# Patient Record
Sex: Female | Born: 2000
Health system: Southern US, Community
[De-identification: ages and names within clinical notes are randomized; demographics above are authoritative.]

## PROBLEM LIST (undated history)

## (undated) DIAGNOSIS — R51 Headache: Secondary | ICD-10-CM

## (undated) DIAGNOSIS — R55 Syncope and collapse: Secondary | ICD-10-CM

## (undated) DIAGNOSIS — R519 Headache, unspecified: Secondary | ICD-10-CM

## (undated) HISTORY — DX: Syncope and collapse: R55

## (undated) HISTORY — DX: Headache: R51

## (undated) HISTORY — DX: Headache, unspecified: R51.9

---
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
## 2020-02-24 ENCOUNTER — Encounter: Payer: Self-pay | Admitting: Primary Care

## 2020-02-24 ENCOUNTER — Ambulatory Visit (INDEPENDENT_AMBULATORY_CARE_PROVIDER_SITE_OTHER): Payer: Managed Care, Other (non HMO) | Admitting: Primary Care

## 2020-02-24 ENCOUNTER — Other Ambulatory Visit: Payer: Self-pay

## 2020-02-24 VITALS — BP 110/70 | HR 65 | Temp 96.5°F | Ht 62.0 in | Wt 120.5 lb

## 2020-02-24 DIAGNOSIS — Z3042 Encounter for surveillance of injectable contraceptive: Secondary | ICD-10-CM

## 2020-02-24 DIAGNOSIS — R519 Headache, unspecified: Secondary | ICD-10-CM

## 2020-02-24 DIAGNOSIS — Z113 Encounter for screening for infections with a predominantly sexual mode of transmission: Secondary | ICD-10-CM | POA: Diagnosis not present

## 2020-02-24 DIAGNOSIS — Z30013 Encounter for initial prescription of injectable contraceptive: Secondary | ICD-10-CM

## 2020-02-24 LAB — POCT URINE PREGNANCY: Preg Test, Ur: NEGATIVE

## 2020-02-24 MED ORDER — MEDROXYPROGESTERONE ACETATE 150 MG/ML IM SUSP
150.0000 mg | Freq: Once | INTRAMUSCULAR | Status: AC
  Administered 2020-02-24: 12:00:00 150 mg via INTRAMUSCULAR

## 2020-02-24 NOTE — Addendum Note (Signed)
Addended by: Tawnya Crook on: 02/24/2020 11:43 AM   Modules accepted: Orders

## 2020-02-24 NOTE — Patient Instructions (Signed)
Stop by the lab prior to leaving today. I will notify you of your results once received.   Please update me regarding your headaches.  It was a pleasure to see you today!

## 2020-02-24 NOTE — Assessment & Plan Note (Signed)
Improved but without resolve while on Depo Provera, increased when she came off of Depo Provera.  Long discussion today regarding treatment for frequent headaches including resuming Depo Provera, trying OCP's, daily preventative treatment.   Today she opts to resume Depo Provera and will update if headaches do not resolve. I suspect she'll need daily preventative treatment along with hormone treatment. She will update.

## 2020-02-24 NOTE — Assessment & Plan Note (Signed)
Resuming Depo Provera per patient request. Negative urine pregnancy test today.

## 2020-02-24 NOTE — Assessment & Plan Note (Signed)
Orders placed. Asymptomatic.

## 2020-02-24 NOTE — Progress Notes (Signed)
Subjective:    Patient ID: Tanya Burnett, female    DOB: 10/16/2001, 19 y.o.   MRN: [id number]  HPI  This visit occurred during the SARS-CoV-2 public health emergency.  Safety protocols were in place, including screening questions prior to the visit, additional usage of staff PPE, and extensive cleaning of exam room while observing appropriate contact time as indicated for disinfecting solutions.   Tanya Burnett is a 19 year old female who presents today to resume Depo Provera.  She would like to resume Depo Provera for headaches and pregnancy prevention. She was initiated on Depo Provera in July 2020 for symptoms of menorrhagia, dysmenorrhea, headaches, pregnancy prevention.   She stopped Depo Provera in September 2020 due to intermittent breakthrough vaginal bleeding for two months. While managed on Depo Provera her headaches were less frequent and less intense. She still had headaches every other day.  She would now like to resume Depo Provera for pregnancy prevention and headache management. Since coming off of Depo Provera her headaches are daily and more intense, located around her entire head like a "band". She's not had a menstrual cycle since September 2020. She is sexually active, using condoms for protection.   She would also like STD testing. She denies vaginal itching, vaginal discharge.   Review of Systems  Genitourinary: Negative for dysuria and vaginal discharge.       See HPI  Neurological: Positive for headaches.       Past Medical History:  Diagnosis Date  . Frequent headaches   . Syncope      Social History   Socioeconomic History  . Marital status: Single    Spouse name: Not on file  . Number of children: Not on file  . Years of education: Not on file  . Highest education level: Not on file  Occupational History  . Not on file  Tobacco Use  . Smoking status: Never Smoker  . Smokeless tobacco: Never Used  Substance and Sexual Activity  . Alcohol use:  Never  . Drug use: Never  . Sexual activity: Not on file  Other Topics Concern  . Not on file  Social History Narrative  . Not on file   Social Determinants of Health   Financial Resource Strain:   . Difficulty of Paying Living Expenses: Not on file  Food Insecurity:   . Worried About Programme researcher, broadcasting/film/video in the Last Year: Not on file  . Ran Out of Food in the Last Year: Not on file  Transportation Needs:   . Lack of Transportation (Medical): Not on file  . Lack of Transportation (Non-Medical): Not on file  Physical Activity:   . Days of Exercise per Week: Not on file  . Minutes of Exercise per Session: Not on file  Stress:   . Feeling of Stress : Not on file  Social Connections:   . Frequency of Communication with Friends and Family: Not on file  . Frequency of Social Gatherings with Friends and Family: Not on file  . Attends Religious Services: Not on file  . Active Member of Clubs or Organizations: Not on file  . Attends Banker Meetings: Not on file  . Marital Status: Not on file  Intimate Partner Violence:   . Fear of Current or Ex-Partner: Not on file  . Emotionally Abused: Not on file  . Physically Abused: Not on file  . Sexually Abused: Not on file    No past surgical history on file.  Family History  Problem Relation Age of Onset  . Depression Mother   . Depression Father   . Alcohol abuse Father     No Known Allergies  Current Outpatient Medications on File Prior to Visit  Medication Sig Dispense Refill  . medroxyPROGESTERone (DEPO-PROVERA) 150 MG/ML injection Inject 150 mg into the muscle every 3 (three) months.     No current facility-administered medications on file prior to visit.    BP 110/70   Pulse 65   Temp (!) 96.5 F (35.8 C) (Temporal)   Ht 5\' 2"  (1.575 m)   Wt 120 lb 8 oz (54.7 kg)   LMP  (LMP Unknown)   SpO2 98%   BMI 22.04 kg/m    Objective:   Physical Exam  Constitutional: She appears well-nourished.    Cardiovascular: Normal rate and regular rhythm.  Respiratory: Effort normal and breath sounds normal.  Musculoskeletal:     Cervical back: Neck supple.  Skin: Skin is warm and dry.  Psychiatric: She has a normal mood and affect.           Assessment & Plan:

## 2020-03-04 LAB — C. TRACHOMATIS/N. GONORRHOEAE RNA
C. trachomatis RNA, TMA: NOT DETECTED
N. gonorrhoeae RNA, TMA: NOT DETECTED

## 2020-03-04 LAB — HSV 1/2 AB (IGM), IFA W/RFLX TITER
HSV 1 IgM Screen: NEGATIVE
HSV 2 IgM Screen: NEGATIVE

## 2020-03-04 LAB — HIV ANTIBODY (ROUTINE TESTING W REFLEX): HIV 1&2 Ab, 4th Generation: NONREACTIVE

## 2020-03-04 LAB — HSV(HERPES SIMPLEX VRS) I + II AB-IGG
HAV 1 IGG,TYPE SPECIFIC AB: <0.9 index
HSV 2 IGG,TYPE SPECIFIC AB: <0.9 index

## 2020-03-04 LAB — HEPATITIS C ANTIBODY
Hepatitis C Ab: NONREACTIVE
SIGNAL TO CUT-OFF: 0.01 (ref <1.00)

## 2020-03-04 LAB — RPR: RPR Ser Ql: NONREACTIVE

## 2020-06-02 ENCOUNTER — Encounter: Payer: Self-pay | Admitting: Primary Care

## 2020-06-02 ENCOUNTER — Ambulatory Visit (INDEPENDENT_AMBULATORY_CARE_PROVIDER_SITE_OTHER): Payer: Managed Care, Other (non HMO) | Admitting: Primary Care

## 2020-06-02 ENCOUNTER — Other Ambulatory Visit: Payer: Self-pay

## 2020-06-02 ENCOUNTER — Telehealth: Payer: Self-pay

## 2020-06-02 VITALS — BP 112/70 | HR 72 | Temp 96.1°F | Ht 62.0 in | Wt 118.5 lb

## 2020-06-02 DIAGNOSIS — R519 Headache, unspecified: Secondary | ICD-10-CM

## 2020-06-02 MED ORDER — PROPRANOLOL HCL ER 80 MG PO CP24: 80.0000 mg | ORAL_CAPSULE | Freq: Every day | ORAL | 1 refills | Status: DC

## 2020-06-02 NOTE — Assessment & Plan Note (Signed)
Continued despite Depo Provera injections. Discussed options for treatment, she is ready for daily preventative medication.  Rx for propranolol ER 80 mg sent to pharmacy. We will plan to follow up in one month for re-evaluation.

## 2020-06-02 NOTE — Telephone Encounter (Signed)
[facility] Night - Client Nonclinical Telephone Record AccessNurse Client [facility] Night - Client Client Site [facility] [place] - Night Physician [facility] - PHYSICIAN, Crissie Figures- MD Contact Type Call Who Is Calling Patient / Member / Family / Caregiver Caller Name Shreya Lacasse Caller Phone Number [phone] Patient Name Tanya Burnett Patient DOB 10/04/2001 Call Type Message Only Information Provided Reason for Call Request to Schedule Office Appointment Initial Comment Caller states has 7:40 AM appt; GPS says should arrive 7:55 so may be a few minutes late. Additional Comment Caller declined triage. Disp. Time Disposition Final User 06/02/2020 7:14:12 AM General Information Provided Yes Albin Fischer Call Closed By: Albin Fischer Transaction Date/Time: 06/02/2020 7:10:54 AM (ET)

## 2020-06-02 NOTE — Progress Notes (Signed)
Subjective:    Patient ID: Tanya Burnett, female    DOB: 12/17/2001, 19 y.o.   MRN: [id number]  HPI  This visit occurred during the SARS-CoV-2 public health emergency.  Safety protocols were in place, including screening questions prior to the visit, additional usage of staff PPE, and extensive cleaning of exam room while observing appropriate contact time as indicated for disinfecting solutions.   Tanya Burnett is a 19 year old female with a history of frequent headaches, syncope who presents today with a chief complaint of headache.  She was last evaluated for headaches in March 2021, and at that time headaches improved slightly while on Depo Provera so we resumed her treatment. We also discussed the potential need for daily oral preventative treatment if headaches did not resolve with re-initiation of Depo Provera.  Today she endorses that her headaches improved on Depo Provera but they did not resolve, she is out of date range for her Depo Provera injection but is not wanting another.   Her headaches are daily and feel like a "band around my head", described as a throbbing sensation. She has nausea and photophobia daily with headaches. She has vomited a few times with severe headaches. She is taking Ibuprofen and Tylenol on occasion with little improvement.   Review of Systems  Eyes: Positive for photophobia.  Gastrointestinal: Positive for nausea.  Neurological: Positive for headaches. Negative for dizziness.       Past Medical History:  Diagnosis Date  . Frequent headaches   . Syncope      Social History   Socioeconomic History  . Marital status: Single    Spouse name: Not on file  . Number of children: Not on file  . Years of education: Not on file  . Highest education level: Not on file  Occupational History  . Not on file  Tobacco Use  . Smoking status: Never Smoker  . Smokeless tobacco: Never Used  Vaping Use  . Vaping Use: Never used  Substance and Sexual  Activity  . Alcohol use: Never  . Drug use: Never  . Sexual activity: Not on file  Other Topics Concern  . Not on file  Social History Narrative  . Not on file   Social Determinants of Health   Financial Resource Strain:   . Difficulty of Paying Living Expenses:   Food Insecurity:   . Worried About Programme researcher, broadcasting/film/video in the Last Year:   . Barista in the Last Year:   Transportation Needs:   . Freight forwarder (Medical):   Marland Kitchen Lack of Transportation (Non-Medical):   Physical Activity:   . Days of Exercise per Week:   . Minutes of Exercise per Session:   Stress:   . Feeling of Stress :   Social Connections:   . Frequency of Communication with Friends and Family:   . Frequency of Social Gatherings with Friends and Family:   . Attends Religious Services:   . Active Member of Clubs or Organizations:   . Attends Banker Meetings:   Marland Kitchen Marital Status:   Intimate Partner Violence:   . Fear of Current or Ex-Partner:   . Emotionally Abused:   Marland Kitchen Physically Abused:   . Sexually Abused:     No past surgical history on file.  Family History  Problem Relation Age of Onset  . Depression Mother   . Depression Father   . Alcohol abuse Father     No Known Allergies  Current Outpatient Medications on File Prior to Visit  Medication Sig Dispense Refill  . medroxyPROGESTERone (DEPO-PROVERA) 150 MG/ML injection Inject 150 mg into the muscle every 3 (three) months. (Patient not taking: Reported on 06/02/2020)     No current facility-administered medications on file prior to visit.    BP 112/70   Pulse 72   Temp (!) 96.1 F (35.6 C) (Temporal)   Ht 5\' 2"  (1.575 m)   Wt 118 lb 8 oz (53.8 kg)   SpO2 97%   BMI 21.67 kg/m    Objective:   Physical Exam  Cardiovascular: Normal rate and regular rhythm.  Respiratory: Effort normal and breath sounds normal.  Neurological: She is alert. No cranial nerve deficit.  Psychiatric: Mood normal.             Assessment & Plan:

## 2020-06-02 NOTE — Patient Instructions (Signed)
Start propranolol ER 80 mg once daily for headache prevention.   Schedule a follow up visit with me for one month for re-evaluation.  It was a pleasure to see you today!

## 2020-06-02 NOTE — Telephone Encounter (Signed)
Per chart review tab pt was seen this morning at [facility].

## 2020-06-02 NOTE — Telephone Encounter (Signed)
Noted. Patient was seen today.

## 2020-06-14 ENCOUNTER — Ambulatory Visit: Payer: Managed Care, Other (non HMO)

## 2020-06-15 ENCOUNTER — Other Ambulatory Visit: Payer: Self-pay

## 2020-06-15 ENCOUNTER — Ambulatory Visit (INDEPENDENT_AMBULATORY_CARE_PROVIDER_SITE_OTHER): Payer: Managed Care, Other (non HMO)

## 2020-06-15 DIAGNOSIS — Z30013 Encounter for initial prescription of injectable contraceptive: Secondary | ICD-10-CM | POA: Diagnosis not present

## 2020-06-15 LAB — POCT URINE PREGNANCY: Preg Test, Ur: NEGATIVE

## 2020-06-21 ENCOUNTER — Ambulatory Visit: Payer: Managed Care, Other (non HMO) | Admitting: Primary Care

## 2020-06-22 ENCOUNTER — Ambulatory Visit (INDEPENDENT_AMBULATORY_CARE_PROVIDER_SITE_OTHER): Payer: Managed Care, Other (non HMO) | Admitting: *Deleted

## 2020-06-22 ENCOUNTER — Other Ambulatory Visit: Payer: Self-pay

## 2020-06-22 DIAGNOSIS — Z3042 Encounter for surveillance of injectable contraceptive: Secondary | ICD-10-CM | POA: Diagnosis not present

## 2020-06-22 LAB — POCT URINE PREGNANCY: Preg Test, Ur: NEGATIVE

## 2020-06-22 MED ORDER — MEDROXYPROGESTERONE ACETATE 150 MG/ML IM SUSP
150.0000 mg | Freq: Once | INTRAMUSCULAR | Status: AC
  Administered 2020-06-22: 150 mg via INTRAMUSCULAR

## 2020-06-22 NOTE — Progress Notes (Signed)
Per orders of Mayra Reel, NP, injection of medroxyPROGESTERone (DEPO-PROVERA) injection 150 mg given by Wilfrid Lund. Patient tolerated injection well.

## 2020-07-03 ENCOUNTER — Ambulatory Visit: Payer: Managed Care, Other (non HMO) | Admitting: Primary Care

## 2020-07-03 DIAGNOSIS — Z0289 Encounter for other administrative examinations: Secondary | ICD-10-CM

## 2020-09-07 ENCOUNTER — Telehealth: Payer: Self-pay

## 2020-09-07 ENCOUNTER — Telehealth (INDEPENDENT_AMBULATORY_CARE_PROVIDER_SITE_OTHER): Payer: Managed Care, Other (non HMO) | Admitting: Primary Care

## 2020-09-07 ENCOUNTER — Other Ambulatory Visit: Payer: Self-pay | Admitting: Primary Care

## 2020-09-07 ENCOUNTER — Encounter: Payer: Self-pay | Admitting: Primary Care

## 2020-09-07 ENCOUNTER — Telehealth: Payer: Self-pay | Admitting: Primary Care

## 2020-09-07 ENCOUNTER — Other Ambulatory Visit: Payer: Self-pay

## 2020-09-07 VITALS — Ht 62.0 in | Wt 118.0 lb

## 2020-09-07 DIAGNOSIS — R109 Unspecified abdominal pain: Secondary | ICD-10-CM | POA: Insufficient documentation

## 2020-09-07 DIAGNOSIS — R1033 Periumbilical pain: Secondary | ICD-10-CM

## 2020-09-07 NOTE — Telephone Encounter (Signed)
Tanya Burnett had me check into the STAT labs we had ordered for lab collect at [facility] location tonight at [phone].  The labs had been drawn at noon and we still had no results.  When I spoke to [facility] they said it was not seen that they were ordered STAT so they were in transit to [facility] for processing still.  The [employer] did awknowledge that they had in fact been ordered STAT and apologized for the error the lab made.  However with them in transport we will have to wait for results to come through after getting to lab and being processed.   Long story short, Tanya Burnett will you please check the fax machine first thing in the morning for results and pass them on to Dr Sharen Hones.  If there is not fax, please call [facility] or go online to check on the results for specimen [id number] and report to Dr Sharen Hones.  Thank you all for your help getting this taken care of!  Have a great weekend! Tam

## 2020-09-07 NOTE — Progress Notes (Signed)
Subjective:    Patient ID: Tanya Burnett, female    DOB: 07-Jan-2001, 19 y.o.   MRN: [id number]  HPI  Virtual Visit via Video Note  I connected with Tanya Burnett on 09/07/20 at  9:40 AM EDT by a video enabled telemedicine application and verified that I am speaking with the correct person using two identifiers.  Location: Patient: Home Provider: [place]   I discussed the limitations of evaluation and management by telemedicine and the availability of in person appointments. The patient expressed understanding and agreed to proceed.  History of Present Illness:  Tanya Burnett is a 19 year old female with a history of syncope, frequent headaches, leukopenia, proteinuria who presents today with a chief complaint of abdominal pain.  Her pain is located to the periumbilical region which began about three weeks ago. Her abdominal pain is constant, but worse within 10 minuets after eating. She also reports nausea, vomiting, diarrhea with urgency. She is vomiting once two twice daily with and without eating. She is having diarrhea twice to numerous times daily. She had a "fever" twice, doesn't recall the readings.   She's been taking Tylenol, Pepto Bismol, Tylenol, and some Pedialyte without improvement. She is hardly drinking or eating much.   She denies known Covid-19 exposure, cough, loss of taste/smell.  She has not been vaccinated for Covid-19.    Observations/Objective:  Alert and oriented. Appears well, not sickly. No distress. Speaking in complete sentences.  Assessment and Plan:  Acute periumbilical abdominal pain with n/v/d x 1 week. Appears uncomfortable today during visit, abdomen was not tender to her palpation.  Symptoms are concerning given her age, location of pain, and dehydration.  She lives in [place]. We will try to arrange stat labs for [facility] near her. We will also try to arrange stat imaging, CT abdomen/pelvis with contrast to rule out acute  appendicitis/gall bladder involvement.   If we cannot arrange then we will recommend she be seen in the ED near her home.   Follow Up Instructions:  We will be in touch regarding your CT and labs.  It was a pleasure to see you today!    I discussed the assessment and treatment plan with the patient. The patient was provided an opportunity to ask questions and all were answered. The patient agreed with the plan and demonstrated an understanding of the instructions.   The patient was advised to call back or seek an in-person evaluation if the symptoms worsen or if the condition fails to improve as anticipated.    Doreene Nest, NP    Review of Systems  Constitutional: Positive for fever.  Gastrointestinal: Positive for abdominal pain, diarrhea, nausea and vomiting.       Past Medical History:  Diagnosis Date  . Frequent headaches   . Syncope      Social History   Socioeconomic History  . Marital status: Single    Spouse name: Not on file  . Number of children: Not on file  . Years of education: Not on file  . Highest education level: Not on file  Occupational History  . Not on file  Tobacco Use  . Smoking status: Never Smoker  . Smokeless tobacco: Never Used  Vaping Use  . Vaping Use: Never used  Substance and Sexual Activity  . Alcohol use: Never  . Drug use: Never  . Sexual activity: Not on file  Other Topics Concern  . Not on file  Social History Narrative  . Not on  file   Social Determinants of Health   Financial Resource Strain:   . Difficulty of Paying Living Expenses: Not on file  Food Insecurity:   . Worried About Programme researcher, broadcasting/film/video in the Last Year: Not on file  . Ran Out of Food in the Last Year: Not on file  Transportation Needs:   . Lack of Transportation (Medical): Not on file  . Lack of Transportation (Non-Medical): Not on file  Physical Activity:   . Days of Exercise per Week: Not on file  . Minutes of Exercise per Session: Not on  file  Stress:   . Feeling of Stress : Not on file  Social Connections:   . Frequency of Communication with Friends and Family: Not on file  . Frequency of Social Gatherings with Friends and Family: Not on file  . Attends Religious Services: Not on file  . Active Member of Clubs or Organizations: Not on file  . Attends Banker Meetings: Not on file  . Marital Status: Not on file  Intimate Partner Violence:   . Fear of Current or Ex-Partner: Not on file  . Emotionally Abused: Not on file  . Physically Abused: Not on file  . Sexually Abused: Not on file    History reviewed. No pertinent surgical history.  Family History  Problem Relation Age of Onset  . Depression Mother   . Depression Father   . Alcohol abuse Father     No Known Allergies  No current outpatient medications on file prior to visit.   No current facility-administered medications on file prior to visit.    Ht 5\' 2"  (1.575 m)   Wt 118 lb (53.5 kg)   BMI 21.58 kg/m    Objective:   Physical Exam Constitutional:      General: She is not in acute distress.    Comments: Appears tired  Abdominal:     Tenderness: There is no abdominal tenderness.  Neurological:     Mental Status: She is alert and oriented to person, place, and time.            Assessment & Plan:

## 2020-09-07 NOTE — Assessment & Plan Note (Signed)
Acute periumbilical abdominal pain with n/v/d x 1 week. Appears uncomfortable today during visit, abdomen was not tender to her palpation.  Symptoms are concerning given her age, location of pain, and dehydration.  She lives in [place]. We will try to arrange stat labs for [facility] near her. We will also try to arrange stat imaging, CT abdomen/pelvis with contrast to rule out acute appendicitis/gall bladder involvement.   If we cannot arrange then we will recommend she be seen in the ED near her home.

## 2020-09-07 NOTE — Patient Instructions (Signed)
We will be in touch regarding your CT and labs.  It was a pleasure to see you today!

## 2020-09-07 NOTE — Telephone Encounter (Signed)
I attempted to reach patient via phone regarding her CT abdomen/pelvis results, the number called "was not receiving calls". Please try to reach patient and notify her that her CT scan was negative.  I am still waiting on her STAT labs which should be in by tomorrow morning. [facility] did not send them out STAT despite my order. They are correcting their error now and will call us in the morning.  Nicki Reaper will be covering for me, see her for assistance as I will be out of the office.

## 2020-09-07 NOTE — Addendum Note (Signed)
Addended by: Aquilla Solian on: 09/07/2020 05:10 PM   Modules accepted: Orders

## 2020-09-07 NOTE — Telephone Encounter (Signed)
Tanya Canes, do you mind taking a look at these labs in the AM? I will be on the road. Rene Kocher is covering for me but won't be in until 11 am. CT scan negative.

## 2020-09-08 LAB — COMPREHENSIVE METABOLIC PANEL
ALT: 8 IU/L (ref 0–32)
AST: 14 IU/L (ref 0–40)
Albumin/Globulin Ratio: 1.8 (ref 1.2–2.2)
Albumin: 4.9 g/dL (ref 3.9–5.0)
Alkaline Phosphatase: 63 IU/L (ref 42–106)
BUN/Creatinine Ratio: 8 — ABNORMAL LOW (ref 9–23)
BUN: 6 mg/dL (ref 6–20)
Bilirubin Total: 0.4 mg/dL (ref 0.0–1.2)
CO2: 24 mmol/L (ref 20–29)
Calcium: 9.9 mg/dL (ref 8.7–10.2)
Chloride: 103 mmol/L (ref 96–106)
Creatinine, Ser: 0.74 mg/dL (ref 0.57–1.00)
GFR calc Af Amer: 137 mL/min/{1.73_m2} (ref >59)
GFR calc non Af Amer: 119 mL/min/{1.73_m2} (ref >59)
Globulin, Total: 2.8 g/dL (ref 1.5–4.5)
Glucose: 87 mg/dL (ref 65–99)
Potassium: 4 mmol/L (ref 3.5–5.2)
Sodium: 141 mmol/L (ref 134–144)
Total Protein: 7.7 g/dL (ref 6.0–8.5)

## 2020-09-08 LAB — CBC WITH DIFFERENTIAL/PLATELET
Basophils Absolute: 0 10*3/uL (ref 0.0–0.2)
Basos: 1 %
EOS (ABSOLUTE): 0.1 10*3/uL (ref 0.0–0.4)
Eos: 2 %
Hematocrit: 41.3 % (ref 34.0–46.6)
Hemoglobin: 13.8 g/dL (ref 11.1–15.9)
Immature Grans (Abs): 0 10*3/uL (ref 0.0–0.1)
Immature Granulocytes: 0 %
Lymphocytes Absolute: 1.1 10*3/uL (ref 0.7–3.1)
Lymphs: 34 %
MCH: 29.1 pg (ref 26.6–33.0)
MCHC: 33.4 g/dL (ref 31.5–35.7)
MCV: 87 fL (ref 79–97)
Monocytes Absolute: 0.2 10*3/uL (ref 0.1–0.9)
Monocytes: 7 %
Neutrophils Absolute: 1.9 10*3/uL (ref 1.4–7.0)
Neutrophils: 56 %
Platelets: 379 10*3/uL (ref 150–450)
RBC: 4.74 x10E6/uL (ref 3.77–5.28)
RDW: 12.8 % (ref 11.7–15.4)
WBC: 3.3 10*3/uL — ABNORMAL LOW (ref 3.4–10.8)

## 2020-09-08 LAB — LIPASE: Lipase: 20 U/L (ref 14–72)

## 2020-09-08 NOTE — Telephone Encounter (Signed)
Called patient reviewed all information and repeated back to me. Will call if any questions.  ? ?

## 2020-09-08 NOTE — Telephone Encounter (Signed)
Attempted to contact pt using phn # provided.  Message states "person trying to reach is not accepting calls at this time".  Lvm on mom's home # asking her to have pt call back.  Need to relay Dr. Timoteo Expose message and get answer to his question.

## 2020-09-08 NOTE — Telephone Encounter (Signed)
Called patient gave information she is having some improvement on abdominal pain today. No new symptoms. Informed we will call about pending results once received.

## 2020-09-08 NOTE — Telephone Encounter (Addendum)
plz notify labs returned reassuringly normal.  Lipase pending.  How are GI symptoms today?  I don't see CT scan results.

## 2020-09-08 NOTE — Telephone Encounter (Signed)
Labs still pending at this time.

## 2020-09-18 ENCOUNTER — Ambulatory Visit (INDEPENDENT_AMBULATORY_CARE_PROVIDER_SITE_OTHER): Payer: Managed Care, Other (non HMO) | Admitting: Internal Medicine

## 2020-09-18 ENCOUNTER — Encounter: Payer: Self-pay | Admitting: Internal Medicine

## 2020-09-18 ENCOUNTER — Other Ambulatory Visit: Payer: Self-pay

## 2020-09-18 VITALS — BP 116/70 | HR 88 | Temp 98.5°F | Wt 110.0 lb

## 2020-09-18 DIAGNOSIS — G44229 Chronic tension-type headache, not intractable: Secondary | ICD-10-CM

## 2020-09-18 MED ORDER — KETOROLAC TROMETHAMINE 30 MG/ML IJ SOLN
30.0000 mg | Freq: Once | INTRAMUSCULAR | Status: AC
  Administered 2020-09-18: 30 mg via INTRAMUSCULAR

## 2020-09-18 MED ORDER — AMITRIPTYLINE HCL 10 MG PO TABS: 10.0000 mg | ORAL_TABLET | Freq: Every day | ORAL | 0 refills | Status: DC

## 2020-09-18 NOTE — Addendum Note (Signed)
Addended by: Roena Malady on: 09/18/2020 03:34 PM   Modules accepted: Orders

## 2020-09-18 NOTE — Patient Instructions (Signed)
General Headache Without Cause A headache is pain or discomfort that is felt around the head or neck area. There are many causes and types of headaches. In some cases, the cause may not be found. Follow these instructions at home: Watch your condition for any changes. Let your doctor know about them. Take these steps to help with your condition: Managing pain      Take over-the-counter and prescription medicines only as told by your doctor.  Lie down in a dark, quiet room when you have a headache.  If told, put ice on your head and neck area: ? Put ice in a plastic bag. ? Place a towel between your skin and the bag. ? Leave the ice on for 20 minutes, 2-3 times per day.  If told, put heat on the affected area. Use the heat source that your doctor recommends, such as a moist heat pack or a heating pad. ? Place a towel between your skin and the heat source. ? Leave the heat on for 20-30 minutes. ? Remove the heat if your skin turns bright red. This is very important if you are unable to feel pain, heat, or cold. You may have a greater risk of getting burned.  Keep lights dim if bright lights bother you or make your headaches worse. Eating and drinking  Eat meals on a regular schedule.  If you drink alcohol: ? Limit how much you use to:  0-1 drink a day for women.  0-2 drinks a day for men. ? Be aware of how much alcohol is in your drink. In the [place] one drink equals one 12 oz bottle of beer (355 mL), one 5 oz glass of wine (148 mL), or one 1 oz glass of hard liquor (44 mL).  Stop drinking caffeine, or reduce how much caffeine you drink. General instructions   Keep a journal to find out if certain things bring on headaches. For example, write down: ? What you eat and drink. ? How much sleep you get. ? Any change to your diet or medicines.  Get a massage or try other ways to relax.  Limit stress.  Sit up straight. Do not tighten (tense) your muscles.  Do not use any  products that contain nicotine or tobacco. This includes cigarettes, e-cigarettes, and chewing tobacco. If you need help quitting, ask your doctor.  Exercise regularly as told by your doctor.  Get enough sleep. This often means 7-9 hours of sleep each night.  Keep all follow-up visits as told by your doctor. This is important. Contact a doctor if:  Your symptoms are not helped by medicine.  You have a headache that feels different than the other headaches.  You feel sick to your stomach (nauseous) or you throw up (vomit).  You have a fever. Get help right away if:  Your headache gets very bad quickly.  Your headache gets worse after a lot of physical activity.  You keep throwing up.  You have a stiff neck.  You have trouble seeing.  You have trouble speaking.  You have pain in the eye or ear.  Your muscles are weak or you lose muscle control.  You lose your balance or have trouble walking.  You feel like you will pass out (faint) or you pass out.  You are mixed up (confused).  You have a seizure. Summary  A headache is pain or discomfort that is felt around the head or neck area.  There are many causes and   types of headaches. In some cases, the cause may not be found.  Keep a journal to help find out what causes your headaches. Watch your condition for any changes. Let your doctor know about them.  Contact a doctor if you have a headache that is different from usual, or if your headache is not helped by medicine.  Get help right away if your headache gets very bad, you throw up, you have trouble seeing, you lose your balance, or you have a seizure. This information is not intended to replace advice given to you by your health care provider. Make sure you discuss any questions you have with your health care provider. Document Revised: 06/29/2018 Document Reviewed: 06/29/2018 Elsevier Patient Education  2020 [employer]  
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
## 2020-10-03 ENCOUNTER — Encounter: Payer: Self-pay | Admitting: Primary Care

## 2020-10-03 ENCOUNTER — Telehealth (INDEPENDENT_AMBULATORY_CARE_PROVIDER_SITE_OTHER): Payer: Managed Care, Other (non HMO) | Admitting: Primary Care

## 2020-10-03 ENCOUNTER — Other Ambulatory Visit: Payer: Self-pay

## 2020-10-03 DIAGNOSIS — R1010 Upper abdominal pain, unspecified: Secondary | ICD-10-CM

## 2020-10-03 DIAGNOSIS — R519 Headache, unspecified: Secondary | ICD-10-CM | POA: Diagnosis not present

## 2020-10-03 DIAGNOSIS — M25531 Pain in right wrist: Secondary | ICD-10-CM | POA: Diagnosis not present

## 2020-10-03 DIAGNOSIS — M25539 Pain in unspecified wrist: Secondary | ICD-10-CM | POA: Insufficient documentation

## 2020-10-03 MED ORDER — AMITRIPTYLINE HCL 10 MG PO TABS: 10.0000 mg | ORAL_TABLET | Freq: Every day | ORAL | 0 refills | Status: DC

## 2020-10-03 NOTE — Assessment & Plan Note (Signed)
Chronic since fall 2020. Encouraged more consistent use of brace, especially while at work.  We will provide her a work note for use of her brace.  She was provided with a link to get on my chart, will send work note to my chart account once she is activated.

## 2020-10-03 NOTE — Assessment & Plan Note (Signed)
Continued, but more so to the bilateral upper abdomen.  CT scan with normal gallbladder.  Labs negative. She denies esophageal reflux symptoms.  Discussed to work on smaller more frequent meals daily, rather than 1 large meal daily.  She was eating [employer] during her visit without any problems.  Consider GI referral if symptoms persist. Could try PPI, she will update when she improves her diet.

## 2020-10-03 NOTE — Assessment & Plan Note (Signed)
Lost to follow-up.  Now improved with daily amitriptyline 10 mg.  Refill provided.

## 2020-10-03 NOTE — Patient Instructions (Signed)
Continue taking amitriptyline 10 mg nightly for headache prevention.  I will send a refill to the pharmacy.  Wear your brace at work and or when you are active.  Try to avoid repetitive movement to the right wrist and arm.  Work on [employer], try eating 3 small meals daily rather than 1 large meal daily.  Please update me on my chart as discussed.  It was a pleasure to see you today! Mayra Reel, NP-C
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
## 2021-01-31 ENCOUNTER — Ambulatory Visit (INDEPENDENT_AMBULATORY_CARE_PROVIDER_SITE_OTHER): Payer: BC Managed Care – PPO | Admitting: Primary Care

## 2021-01-31 ENCOUNTER — Encounter: Payer: Self-pay | Admitting: Primary Care

## 2021-01-31 ENCOUNTER — Other Ambulatory Visit: Payer: Self-pay

## 2021-01-31 VITALS — BP 120/74 | HR 76 | Temp 97.4°F | Ht 62.0 in | Wt 114.0 lb

## 2021-01-31 DIAGNOSIS — R519 Headache, unspecified: Secondary | ICD-10-CM | POA: Diagnosis not present

## 2021-01-31 DIAGNOSIS — Z30013 Encounter for initial prescription of injectable contraceptive: Secondary | ICD-10-CM | POA: Diagnosis not present

## 2021-01-31 LAB — POCT URINE PREGNANCY: Preg Test, Ur: NEGATIVE

## 2021-01-31 MED ORDER — MEDROXYPROGESTERONE ACETATE 150 MG/ML IM SUSP
150.0000 mg | Freq: Once | INTRAMUSCULAR | Status: AC
  Administered 2021-01-31: 150 mg via INTRAMUSCULAR

## 2021-01-31 MED ORDER — MEDROXYPROGESTERONE ACETATE 150 MG/ML IM SUSP: 150.0000 mg | INTRAMUSCULAR | 2 refills | Status: DC

## 2021-01-31 MED ORDER — AMITRIPTYLINE HCL 10 MG PO TABS: 10.0000 mg | ORAL_TABLET | Freq: Every day | ORAL | 3 refills | Status: DC

## 2021-01-31 MED ORDER — ALCOHOL PREP PADS: 1.0000 | MEDICATED_PAD | 0 refills | Status: DC | PRN

## 2021-01-31 NOTE — Patient Instructions (Addendum)
Resume your amitriptyline 10 mg at bedtime for headache prevention.   Inject your Depo Provera every three months as instructed today. Review the calendar provided.  It was a pleasure to see you today!  Medroxyprogesterone injection [Contraceptive] What is this medicine? MEDROXYPROGESTERONE (me DROX ee proe JES te rone) contraceptive injections prevent pregnancy. They provide effective birth control for 3 months. Depo-SubQ Provera 104 injection is also used for treating pain related to endometriosis. This medicine may be used for other purposes; ask your health care provider or pharmacist if you have questions. COMMON BRAND NAME(S): Depo-Provera, Depo-subQ Provera 104 What should I tell my health care provider before I take this medicine? They need to know if you have any of these conditions:  asthma  blood clots  breast cancer or family history of breast cancer  depression  diabetes  eating disorder (anorexia nervosa)  heart attack  high blood pressure  HIV infection or AIDS  if you often drink alcohol  kidney disease  liver disease  migraine headaches  osteoporosis, weak bones  seizures  stroke  tobacco smoker  vaginal bleeding  an unusual or allergic reaction to medroxyprogesterone, other hormones, medicines, foods, dyes, or preservatives  pregnant or trying to get pregnant  breast-feeding How should I use this medicine? Depo-Provera CI contraceptive injection is given into a muscle. Depo-subQ Provera 104 injection is given under the skin. It is given by a health care provider in a hospital or clinic setting. The injection is usually given during the first 5 days after the start of a menstrual period or 6 weeks after delivery of a baby. A patient package insert for the product will be given with each prescription and refill. Be sure to read this information carefully each time. The sheet may change often. Talk to your pediatrician regarding the use of this  medicine in children. Special care may be needed. These injections have been used in female children who have started having menstrual periods. Overdosage: If you think you have taken too much of this medicine contact a poison control center or emergency room at once. NOTE: This medicine is only for you. Do not share this medicine with others. What if I miss a dose? Keep appointments for follow-up doses. You must get an injection once every 3 months. It is important not to miss your dose. Call your health care provider if you are unable to keep an appointment. What may interact with this medicine?  antibiotics or medicines for infections, especially rifampin and griseofulvin  antivirals for HIV or hepatitis  aprepitant  armodafinil  bexarotene  bosentan  medicines for seizures like carbamazepine, felbamate, oxcarbazepine, phenytoin, phenobarbital, primidone, topiramate  mitotane  modafinil  St. John's wort This list may not describe all possible interactions. Give your health care provider a list of all the medicines, herbs, non-prescription drugs, or dietary supplements you use. Also tell them if you smoke, drink alcohol, or use illegal drugs. Some items may interact with your medicine. What should I watch for while using this medicine? This drug does not protect you against HIV infection (AIDS) or other sexually transmitted diseases. Use of this product may cause you to lose calcium from your bones. Loss of calcium may cause weak bones (osteoporosis). Only use this product for more than 2 years if other forms of birth control are not right for you. The longer you use this product for birth control the more likely you will be at risk for weak bones. Ask your health care  professional how you can keep strong bones. You may have a change in bleeding pattern or irregular periods. Many females stop having periods while taking this drug. If you have received your injections on time, your  chance of being pregnant is very low. If you think you may be pregnant, see your health care professional as soon as possible. Tell your health care professional if you want to get pregnant within the next year. The effect of this medicine may last a long time after you get your last injection. What side effects may I notice from receiving this medicine? Side effects that you should report to your doctor or health care professional as soon as possible:  allergic reactions like skin rash, itching or hives, swelling of the face, lips, or tongue  blood clot (chest pain; shortness of breath; pain, swelling, or warmth in the leg)  breast tenderness or discharge  changes in emotions or moods  changes in vision  liver injury (dark yellow or brown urine; general ill feeling or flu-like symptoms; loss of appetite, right upper belly pain; unusually weak or tired, yellowing of the eyes or skin)  persistent pain, pus, or bleeding at the injection site  stroke (changes in vision; confusion; trouble speaking or understanding; severe headaches; sudden numbness or weakness of the face, arm or leg; trouble walking; dizziness; loss of balance or coordination)  trouble breathing Side effects that usually do not require medical attention (report to your doctor or health care professional if they continue or are bothersome):  change in sex drive  dizziness  fluid retention  headache  irregular periods, spotting, or absent periods  pain, redness, or irritation at site where injected  stomach pain  weight gain This list may not describe all possible side effects. Call your doctor for medical advice about side effects. You may report side effects to FDA at 1-800-FDA-1088. Where should I keep my medicine? This injection is only given by a health care provider. It will not be stored at home. NOTE: This sheet is a summary. It may not cover all possible information. If you have questions about this  medicine, talk to your doctor, pharmacist, or health care provider.  2021 Elsevier/Gold Standard (2020-01-26 10:29:21)

## 2021-01-31 NOTE — Progress Notes (Signed)
Subjective:    Patient ID: Tanya Burnett, female    DOB: 04-03-2001, 20 y.o.   MRN: [id number]  HPI  This visit occurred during the SARS-CoV-2 public health emergency.  Safety protocols were in place, including screening questions prior to the visit, additional usage of staff PPE, and extensive cleaning of exam room while observing appropriate contact time as indicated for disinfecting solutions.   Tanya Burnett is a 20 year old female with a history of syncope, frequent headaches who presents today to discuss resuming Depo Provera and also for medication refill.   She was once managed on Depo Provera injections, last injection provided in July 2021. She would like to resume her Depo Provera for birth control purposes. LMP was in late January 2022. Monthly menses, spotting and sometimes heavier. She is not sexually active, but is dating someone new. She lives in [place] and has a hard time coming to our clinic every 3 months for injections. She is interested in learning how to administer injections herself.   She was doing very well on her amitriptyline 10 mg nightly for headache prevention, but she ran out of her prescription a few weeks ago and headaches have resumed. With her medication her headaches have tremendously improved.   BP Readings from Last 3 Encounters:  01/31/21 120/74  09/18/20 116/70  06/02/20 112/70     Review of Systems  Genitourinary: Negative for dysuria, hematuria, menstrual problem and vaginal discharge.  Neurological:       Headaches improved on amitriptyline       Past Medical History:  Diagnosis Date  . Frequent headaches   . Syncope      Social History   Socioeconomic History  . Marital status: Single    Spouse name: Not on file  . Number of children: Not on file  . Years of education: Not on file  . Highest education level: Not on file  Occupational History  . Not on file  Tobacco Use  . Smoking status: Never Smoker  . Smokeless  tobacco: Never Used  Vaping Use  . Vaping Use: Never used  Substance and Sexual Activity  . Alcohol use: Never  . Drug use: Never  . Sexual activity: Not on file  Other Topics Concern  . Not on file  Social History Narrative  . Not on file   Social Determinants of Health   Financial Resource Strain: Not on file  Food Insecurity: Not on file  Transportation Needs: Not on file  Physical Activity: Not on file  Stress: Not on file  Social Connections: Not on file  Intimate Partner Violence: Not on file    History reviewed. No pertinent surgical history.  Family History  Problem Relation Age of Onset  . Depression Mother   . Depression Father   . Alcohol abuse Father     No Known Allergies  Current Outpatient Medications on File Prior to Visit  Medication Sig Dispense Refill  . amitriptyline (ELAVIL) 10 MG tablet Take 1 tablet (10 mg total) by mouth at bedtime. For headache prevention. 90 tablet 0   No current facility-administered medications on file prior to visit.    BP 120/74   Pulse 76   Temp (!) 97.4 F (36.3 C) (Temporal)   Ht 5\' 2"  (1.575 m)   Wt 114 lb (51.7 kg)   SpO2 98%   BMI 20.85 kg/m    Objective:   Physical Exam Constitutional:      Appearance: She is well-nourished.  Cardiovascular:     Rate and Rhythm: Normal rate and regular rhythm.  Pulmonary:     Effort: Pulmonary effort is normal.     Breath sounds: Normal breath sounds.  Musculoskeletal:     Cervical back: Neck supple.  Skin:    General: Skin is warm and dry.  Psychiatric:        Mood and Affect: Mood and affect and mood normal.            Assessment & Plan:

## 2021-01-31 NOTE — Assessment & Plan Note (Signed)
Much improved when taking amitriptyline 10 mg nightly.  Refills provided today.

## 2021-01-31 NOTE — Assessment & Plan Note (Signed)
Agree to resume Depo Provera injections, has done well on this regimen before.   She lives in [place] and has a difficult time driving to our office for injections every 3 months.  Thorough teaching provided today regarding directions and administration of Depo Provera injections. Teach back initiated, she was able to demonstrate correct administration today. Handout provided regarding due dates.   Urine pregnancy negative.  Rx for Depo Provera injections sent to pharmacy. First injection provided today.

## 2021-03-13 ENCOUNTER — Ambulatory Visit: Payer: BC Managed Care – PPO | Admitting: Primary Care

## 2021-03-13 DIAGNOSIS — Z0289 Encounter for other administrative examinations: Secondary | ICD-10-CM

## 2021-03-15 ENCOUNTER — Telehealth: Payer: Self-pay | Admitting: Primary Care

## 2021-03-15 NOTE — Telephone Encounter (Signed)
She is due next 4/27 - 5/11. Called patient gave information and repeated back to me. Will call if any questions.

## 2021-03-15 NOTE — Telephone Encounter (Signed)
Tanya Burnett called in wanted to know when it was time for her next depo injection.  Please advise

## 2021-06-22 IMAGING — DX DG ABDOMEN ACUTE W/ 1V CHEST
3 series · 3 of 3 positions shown · non-contrast
Comparison: None.

CLINICAL DATA: Left lower quadrant pain and chest pain

EXAM:
DG ABDOMEN ACUTE W/ 1V CHEST

[chest pa]
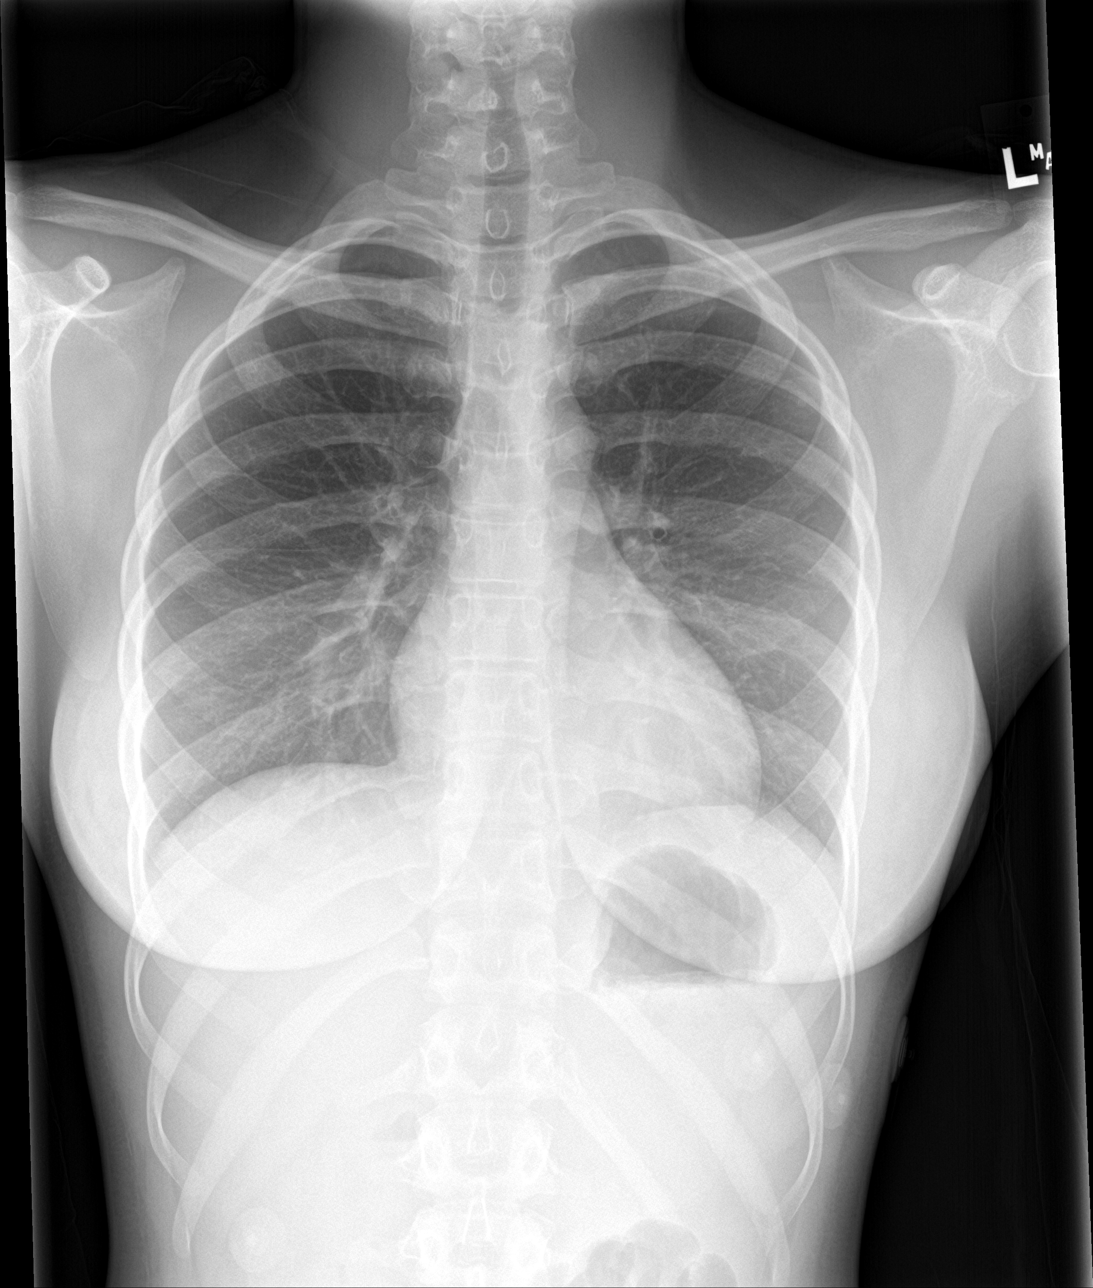

[abdomen erect]
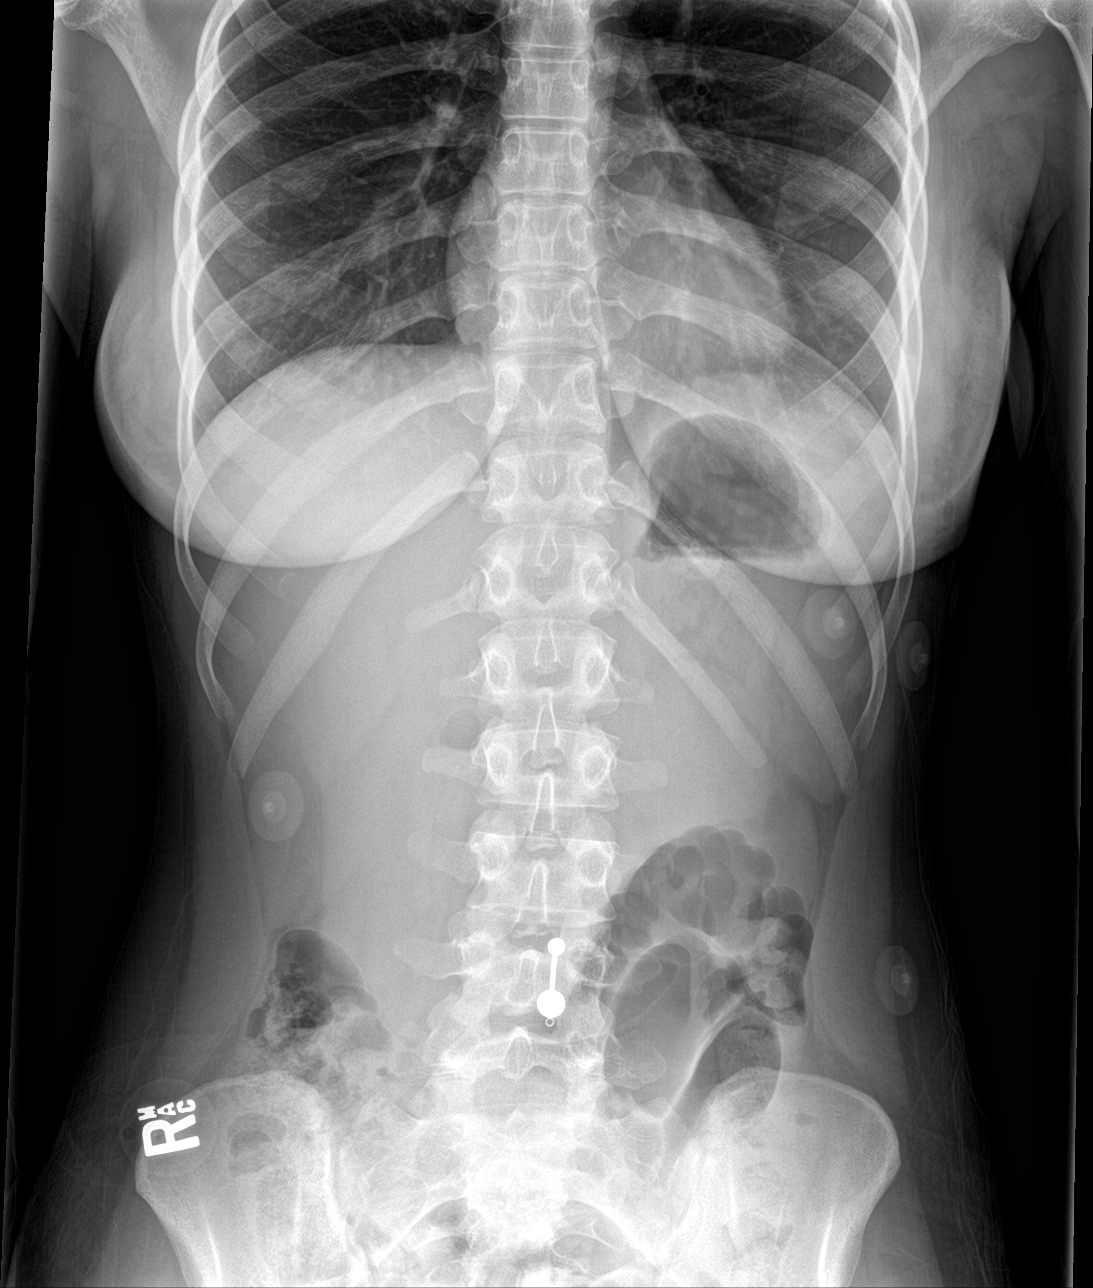

[abdomen supine]
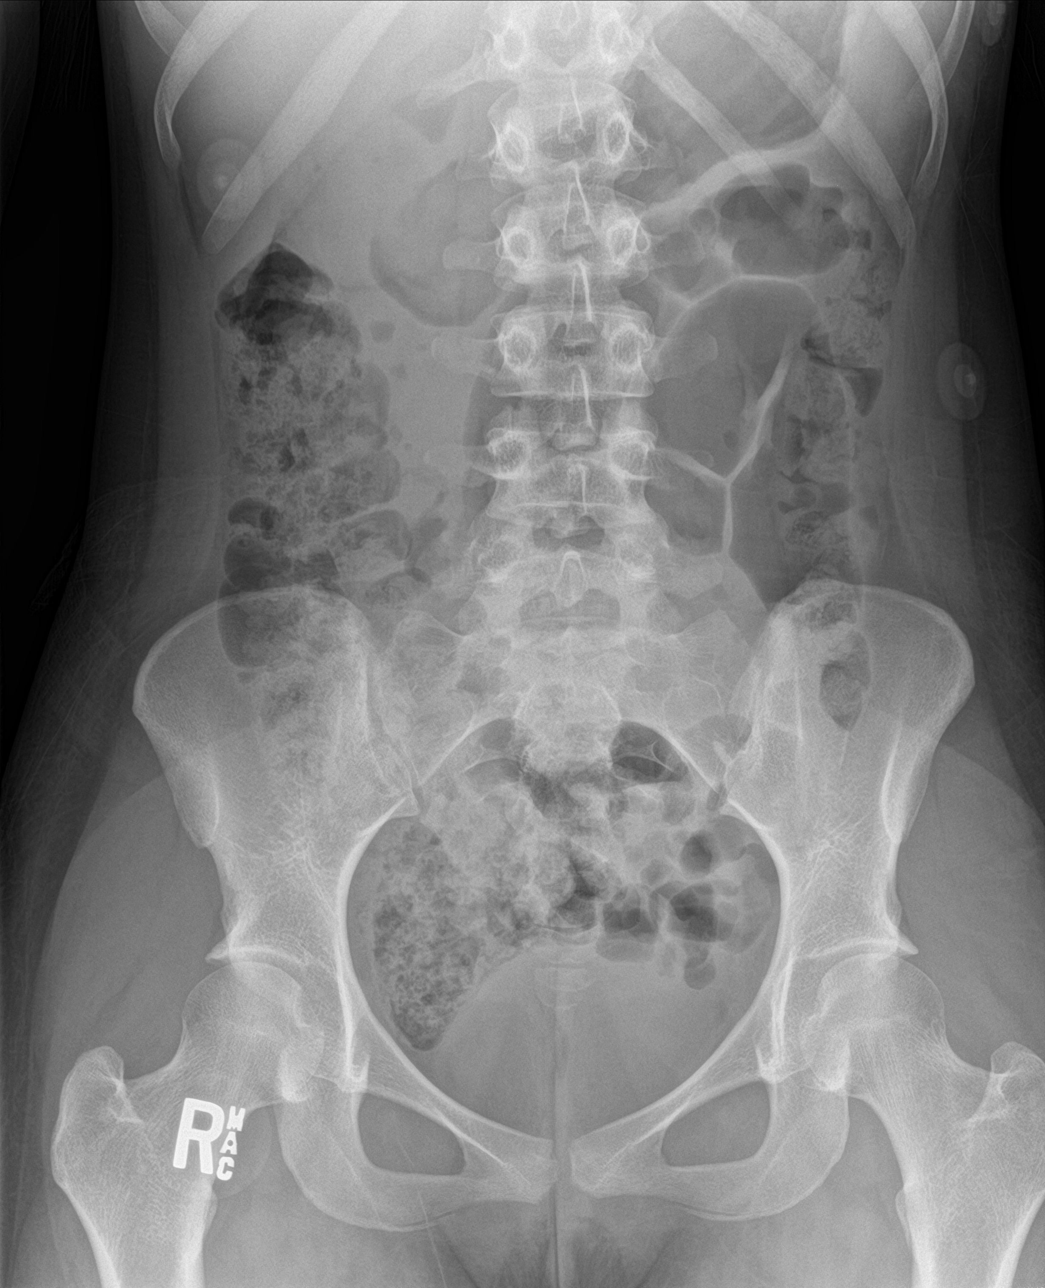

[3 of 3 positions shown; findings below may reference images not displayed]

FINDINGS: There is no evidence of dilated bowel loops or free intraperitoneal
air. No radiopaque calculi or other significant radiographic
abnormality is seen. Heart size and mediastinal contours are within
normal limits. Both lungs are clear.
IMPRESSION: Negative abdominal radiographs.  No acute cardiopulmonary disease.

## 2021-07-18 ENCOUNTER — Encounter: Payer: BC Managed Care – PPO | Admitting: Primary Care

## 2021-07-18 DIAGNOSIS — Z111 Encounter for screening for respiratory tuberculosis: Secondary | ICD-10-CM | POA: Diagnosis not present

## 2021-08-29 ENCOUNTER — Encounter: Payer: Self-pay | Admitting: Primary Care

## 2021-08-29 ENCOUNTER — Other Ambulatory Visit: Payer: Self-pay

## 2021-08-29 ENCOUNTER — Ambulatory Visit (INDEPENDENT_AMBULATORY_CARE_PROVIDER_SITE_OTHER): Payer: BC Managed Care – PPO | Admitting: Primary Care

## 2021-08-29 VITALS — BP 118/64 | HR 92 | Temp 98.1°F | Ht 62.2 in | Wt 122.0 lb

## 2021-08-29 DIAGNOSIS — T7840XS Allergy, unspecified, sequela: Secondary | ICD-10-CM | POA: Diagnosis not present

## 2021-08-29 DIAGNOSIS — T7840XA Allergy, unspecified, initial encounter: Secondary | ICD-10-CM | POA: Insufficient documentation

## 2021-08-29 DIAGNOSIS — L509 Urticaria, unspecified: Secondary | ICD-10-CM | POA: Insufficient documentation

## 2021-08-29 MED ORDER — EPINEPHRINE 0.3 MG/0.3ML IJ SOAJ: 0.3000 mg | INTRAMUSCULAR | 0 refills | Status: DC | PRN

## 2021-08-29 MED ORDER — CETIRIZINE HCL 10 MG PO TABS: 10.0000 mg | ORAL_TABLET | Freq: Every day | ORAL | 1 refills | Status: DC

## 2021-08-29 NOTE — Assessment & Plan Note (Signed)
Chronic history, last episode was a few weeks ago.  No rashes or symptoms today.  Checking food and environmental allergy blood testing.  Rx for Epipen sent to pharmacy, discussed when and how to use.  Start zyrtec 10 mg daily. Referral placed to allergist.  

## 2021-08-29 NOTE — Progress Notes (Signed)
Subjective:    Patient ID: Tanya Burnett, female    DOB: [birth year], 20 y.o.   MRN: [id number]  HPI  Tanya Burnett is a very pleasant 20 y.o. female with a history of frequent headaches, leukopenia who presents today to discuss allergic reaction.   Chronic since childhood, overall mild, more frequent and pronounced over the last two years. Symptoms include breaking out in full body hives, lips swollen, throat tightness, itchy mouth, eye swollen, itching to entire body. Symptoms occur one to twice monthly.  She initially thought her symptoms were secondary to seafood, but recently ate seafood and had no symptoms. Symptoms can occur sporadically, without regard to eating, lasting hours for each episode. She will take Benadryl with improvement and eventually resolve.   She has a new cat, has not noticed symptoms when around her cat.   She denies a change in soaps, detergents, supplements, home environments, prior history of asthma. Her last episode was August 24th, was at a non seafood restaurant, mom noticed swollen upper eye lids and rash to eyes. The patient then noticed full body itching with rash.   Today she denies wheezing, shortness of breath, rhinorrhea, nasal congestion, known food allergies, known environmental allergies.    Review of Systems  Constitutional:  Negative for fever.  Respiratory:  Positive for shortness of breath. Negative for wheezing.        See HPI  Cardiovascular:  Negative for chest pain.  Skin:  Positive for rash.       See HPI  Allergic/Immunologic:       See HPI        Past Medical History:  Diagnosis Date   Frequent headaches    Syncope     Social History   Socioeconomic History   Marital status: Single    Spouse name: Not on file   Number of children: Not on file   Years of education: Not on file   Highest education level: Not on file  Occupational History   Not on file  Tobacco Use   Smoking status: Never   Smokeless tobacco:  Never  Vaping Use   Vaping Use: Never used  Substance and Sexual Activity   Alcohol use: Never   Drug use: Never   Sexual activity: Not on file  Other Topics Concern   Not on file  Social History Narrative   Not on file   Social Determinants of Health   Financial Resource Strain: Not on file  Food Insecurity: Not on file  Transportation Needs: Not on file  Physical Activity: Not on file  Stress: Not on file  Social Connections: Not on file  Intimate Partner Violence: Not on file    History reviewed. No pertinent surgical history.  Family History  Problem Relation Age of Onset   Depression Mother    Depression Father    Alcohol abuse Father     No Known Allergies  Current Outpatient Medications on File Prior to Visit  Medication Sig Dispense Refill   amitriptyline (ELAVIL) 10 MG tablet Take 1 tablet (10 mg total) by mouth at bedtime. For headache prevention. (Patient taking differently: Take 10 mg by mouth at bedtime. For headache prevention. Only takes when has headaches) 90 tablet 3   medroxyPROGESTERone (DEPO-PROVERA) 150 MG/ML injection Inject 1 mL (150 mg total) into the muscle every 3 (three) months. (Patient not taking: Reported on 08/29/2021) 1 mL 2   No current facility-administered medications on file prior to visit.    BP  118/64   Pulse 92   Temp 98.1 F (36.7 C) (Temporal)   Ht 5' 2.2" (1.58 m)   Wt 122 lb (55.3 kg)   SpO2 97%   BMI 22.17 kg/m  Objective:   Physical Exam Constitutional:      General: She is not in acute distress. Cardiovascular:     Rate and Rhythm: Normal rate and regular rhythm.  Pulmonary:     Effort: Pulmonary effort is normal.     Breath sounds: Normal breath sounds. No wheezing.  Musculoskeletal:     Cervical back: Neck supple.  Skin:    General: Skin is warm and dry.     Findings: No rash.          Assessment & Plan:      This visit occurred during the SARS-CoV-2 public health emergency.  Safety protocols  were in place, including screening questions prior to the visit, additional usage of staff PPE, and extensive cleaning of exam room while observing appropriate contact time as indicated for disinfecting solutions.

## 2021-08-29 NOTE — Assessment & Plan Note (Signed)
Chronic history, last episode was a few weeks ago.  No rashes or symptoms today.  Checking food and environmental allergy blood testing.  Rx for Epipen sent to pharmacy, discussed when and how to use.  Start zyrtec 10 mg daily. Referral placed to allergist.

## 2021-08-29 NOTE — Patient Instructions (Signed)
Start cetrizine (Zyrtec) 10 mg every night for hives/allergies.  Use the Epipen for emergencies as discussed.   Stop by the lab prior to leaving today. I will notify you of your results once received.   You will be contacted regarding your referral to the allergist.  Please let us know if you have not been contacted within two weeks.  It was a pleasure to see you today!

## 2021-09-03 LAB — ALLERGY PANEL, REGION 2, TREES
Allergen, White ASH,t15: 0.31 kU/L — ABNORMAL HIGH
Box Elder IgE: 1.01 kU/L — ABNORMAL HIGH
Class: 2
Class: 2
Class: 3
Class: 3
Elm IgE: 3.51 kU/L — ABNORMAL HIGH
Pecan/Hickory Tree IgE: 6.88 kU/L — ABNORMAL HIGH
Sycamore Tree: 1.93 kU/L — ABNORMAL HIGH

## 2021-09-03 LAB — FOOD ALLERGY PROFILE
Allergen, Salmon, f41: <0.1 kU/L
Almonds: 1.89 kU/L — ABNORMAL HIGH
CLASS: 0
CLASS: 0
CLASS: 0
CLASS: 0
CLASS: 1
CLASS: 2
CLASS: 2
CLASS: 3
CLASS: 3
Cashew IgE: <0.1 kU/L
Class: 0
Class: 0
Class: 0
Class: 3
Egg White IgE: <0.1 kU/L
Fish Cod: <0.1 kU/L
Hazelnut: 7.37 kU/L — ABNORMAL HIGH
Milk IgE: <0.1 kU/L
Peanut IgE: 6.74 kU/L — ABNORMAL HIGH
Scallop IgE: 0.13 kU/L — ABNORMAL HIGH
Sesame Seed f10: 0.66 kU/L — ABNORMAL HIGH
Shrimp IgE: <0.1 kU/L
Soybean IgE: 1.69 kU/L — ABNORMAL HIGH
Tuna IgE: <0.1 kU/L
Walnut: 11.2 kU/L — ABNORMAL HIGH
Wheat IgE: 0.26 kU/L — ABNORMAL HIGH

## 2021-09-03 LAB — ALLERGY PANEL 11, MOLD GROUP
Allergen, A. alternata, m6: <0.1 kU/L
Allergen, Mucor Racemosus, M4: <0.1 kU/L
Aspergillus fumigatus, m3: <0.1 kU/L
CLADOSPORIUM HERBARUM (M2) IGE: <0.1 kU/L
CLASS: 0
CLASS: 0
Candida Albicans: <0.1 kU/L
Class: 0
Class: 0
Class: 0

## 2021-09-03 LAB — ALLERGY PANEL, REGION 2, GRASSES
CLASS: 3
CLASS: 3
CLASS: 3
Class: 2
Class: 3
G005 Rye, Perennial: 4.45 kU/L — ABNORMAL HIGH
G009 Red Top: 6.19 kU/L — ABNORMAL HIGH
Johnson Grass: 1.62 kU/L — ABNORMAL HIGH
ORCHARD GRASS (COCKSFOOT) (G3) IGE: 4.97 kU/L — ABNORMAL HIGH
Timothy Grass: 5.62 kU/L — ABNORMAL HIGH

## 2021-09-03 LAB — INTERPRETATION:

## 2021-09-05 ENCOUNTER — Ambulatory Visit (INDEPENDENT_AMBULATORY_CARE_PROVIDER_SITE_OTHER): Payer: BC Managed Care – PPO | Admitting: Internal Medicine

## 2021-09-05 ENCOUNTER — Other Ambulatory Visit: Payer: Self-pay

## 2021-09-05 ENCOUNTER — Encounter: Payer: Self-pay | Admitting: Internal Medicine

## 2021-09-05 VITALS — BP 118/72 | HR 90 | Temp 98.7°F | Resp 16 | Ht 62.0 in | Wt 124.0 lb

## 2021-09-05 DIAGNOSIS — T7800XA Anaphylactic reaction due to unspecified food, initial encounter: Secondary | ICD-10-CM

## 2021-09-05 DIAGNOSIS — L501 Idiopathic urticaria: Secondary | ICD-10-CM | POA: Diagnosis not present

## 2021-09-05 DIAGNOSIS — J302 Other seasonal allergic rhinitis: Secondary | ICD-10-CM | POA: Diagnosis not present

## 2021-09-05 MED ORDER — AZELASTINE HCL 0.1 % NA SOLN: 2.0000 | Freq: Two times a day (BID) | NASAL | 12 refills | Status: DC | PRN

## 2021-09-05 NOTE — Patient Instructions (Addendum)
Chronic Idiopathic Urticaria: - this is defined as hives lasting more than 6 weeks without an identifiable trigger - hives can be from a number of different sources including infections, allergies, vibration, temperature, pressure among many others other possible causes - often an identifiable cause is not determined - some potential triggers include: stress, illness, NSAIDs, aspirin, hormonal changes - you do not have any red flag symptoms to make [place] concerned about secondary causes of hives, but we will screen for these for reassurance with: CBC w diff, CMP, tryptase, TSH, alpha-gal panel, inflammatory markers - no avoidance of any specific foods/soaps/etc is recommended with exception of treenuts and peanuts based on your previous testing (do not feel this is related to your hives based on history, but you may be allergic) - this type of hives can last for months to years (~3-5 years on average) and is considered benign - approximately 50% of patients with chronic hives can have some associated swelling of the face/lips/eyelids (this is not a cause for alarm and does not typically progress onto systemic allergic reactions) Therapy Plan:  - start zyrtec (cetirizine) 10mg  once daily - if hives are uncontrolled, increase zyrtec (cetirizine) to 10mg  twice daily - if hives remain uncontrolled, increase dose of zyrtec (cetirizine) to max dose of 20mg  (2 pills) twice daily- this is maximum dose - can increase or decrease dosing depending on symptom control to a maximum dose of 4 tablets of antihistamine daily. Wait until hives free for at least one month prior to decreasing dose.   - if hives are still uncontrolled with the above regimen, please arrange an appointment for discussion of Xolair (omalizumab)- an injectable medication for hives  Can use one of the following in place of zyrtec if desires: Claritin (loratadine) 10 mg, Xyzal (levocetirizine) 5 mg or Allegra (fexofenadine) 180 mg daily as  needed     Food Allergy:  - your prior food allergy testing was positive to peanuts, tree nuts, soy and sesame (positive is a result greater than 0.35) - based on your history today, I would advise strict avoidance of peanuts, treenuts and sesame as you may truly be allergic - since you are eating and tolerating soy, would consider this a false positive, would not expect this to be the cause of your hives unless you are able to confirm that you only have these hives on days you eat soy..in which case you should also avoid soy (this did not seem to be the case today) IN CASE OF ACCIDENTAL EXPOSURE - for SKIN only reaction, okay to take Benadryl 2 capsules every 4 hours - for SKIN + ANY additional symptoms, OR IF concern for LIFE THREATENING reaction = Epipen Autoinjector EpiPen 0.3 mg. - If using Epinephrine autoinjector, call 911 - A food allergy action plan has been provided and discussed. - Medic Alert identification is recommended.  Allergic Rhinitis: - Start Flonase (fluticasone) 1- 2 sprays in each nostril daily.  You will only get the full benefit of this medication by using it every day. - Start Astelin (Azelastine) 2 sprays in each nostril twice a day as needed.  You may use this as needed for nasal congestion/itchy ears if desired   - Start Pataday (Olopatadine) 1 drop in each eye daily as needed for itchy/watery eyes. This is over the counter and likely not covered by your insurance. Please go to the following website to check for coupons:  (website to sign up for pataday coupon) - Continue over the counter  antihistamine.  Your options include Zyrtec (Cetirizine) 10mg , Claritin (Loratadine) 10mg , Allegra (Fexofenadine) 180mg , or Xyzal (Levocetirinze) 5mg  daily. - will get a more expanded environmental panel today to get a better picture of what all is causing your symptoms  Xyzal samples provided today.

## 2021-09-05 NOTE — Progress Notes (Signed)
NEW PATIENT Date of Service/Encounter:  09/05/21 Referring provider: Doreene Nest, NP Primary care provider: Doreene Nest, NP  Subjective:  Tanya Burnett is a 20 y.o. female with a PMHx of headaches presenting today for evaluation of hives History obtained from: chart review and patient.   Rash: started many years ago, occurring twice per month, associates extreme itching, eye swelling, lip swelling and feeling of throat tightness Denies other systemic symptoms including no respiratory, gastrointestinal or cardiovascular distress. No changes to personal care products, detergents, etc. She has a cat in the home for over a year. This problem is present since before cat.  Also has dog, and symptoms present since before dog. Therapies tried: Benadryl, but this doesn't seem to help (just puts her to sleep) Potential triggers: no obvious triggers Does not associate fever, joint pain, joint swelling, weight loss.  Lesions resolve in several hours, max 24 hours.  Some bruising on resolution but only if scratching hard. Pictures reviewed and are consistent with hives. She has prescription for Epipen, but has not picked up from pharmacy.  She has eaten peanuts, but doesn't like these, and if has been many years since she had any.  She eats no tree nuts, and has never tried these.  She has never tried sesame seeds. Eats scallops, shrimp, crab leg, lobster, oysters, mussells without problems. She eats soy and wheat without issues.  She also has a long standing history of chronic rhinitis (runny nose, congestion, sneezing, watery/itchy eyes).  She does not seemed bothered by her cat and dog, but does seem bothered by outdoor pollens.  She takes OTC AH for relief, but does not feel this is enough.    Chart review: Patient PCP visit on 08/24/2021 for chronic hives ongoing for many years, worse in the past 2 years.  No obvious triggers.  Started on Zyrtec daily.  Allergy testing via  serum was positive to grasses, trees.  Negative to molds.  Food allergy profile was positive to peanut, walnut, soy, sesame, hazelnut, almond.  Unclear if she is currently eating these foods.  Other allergy screening: Asthma: no Rhino conjunctivitis: yes Food allergy: no Medication allergy: no Hymenoptera allergy: no Urticaria: yes Eczema:yes- as a young child, no longer an active issue  Past Medical History: Past Medical History:  Diagnosis Date   Frequent headaches    Syncope    Medication List:  Current Outpatient Medications  Medication Sig Dispense Refill   amitriptyline (ELAVIL) 10 MG tablet Take 1 tablet (10 mg total) by mouth at bedtime. For headache prevention. (Patient taking differently: Take 10 mg by mouth at bedtime. For headache prevention. Only takes when has headaches) 90 tablet 3   azelastine (ASTELIN) 0.1 % nasal spray Place 2 sprays into both nostrils 2 (two) times daily as needed for rhinitis. Use in each nostril as directed 30 mL 12   cetirizine (ZYRTEC) 10 MG tablet Take 1 tablet (10 mg total) by mouth daily. For allergies. 90 tablet 1   EPINEPHrine 0.3 mg/0.3 mL IJ SOAJ injection Inject 0.3 mg into the muscle as needed for anaphylaxis. 1 each 0   medroxyPROGESTERone (DEPO-PROVERA) 150 MG/ML injection Inject 1 mL (150 mg total) into the muscle every 3 (three) months. 1 mL 2   No current facility-administered medications for this visit.   Known Allergies:  No Known Allergies Past Surgical History: History reviewed. No pertinent surgical history. Family History: Family History  Problem Relation Age of Onset   Depression Mother  Depression Father    Alcohol abuse Father    Social History: Tanya Burnett lives in a house without water damage, carpet in bedroom, electric heating, central AC, pets have cat and dog, no visible cockroaches, no dust mite covers, no smoke exposure.  Works as Building surveyor.   ROS:  All other systems negative except as noted per  HPI.  Objective:  Blood pressure 118/72, pulse 90, temperature 98.7 F (37.1 C), temperature source Temporal, resp. rate 16, height 5\' 2"  (1.575 m), weight 124 lb (56.2 kg), SpO2 97 %. Body mass index is 22.68 kg/m. Physical Exam:  General Appearance:  Alert, cooperative, no distress, appears stated age  Head:  Normocephalic, without obvious abnormality, atraumatic  Eyes:  Conjunctiva clear, EOM's intact  Nose: Nares normal, hypertrophic turbinates, no rhinorrhea  Throat: Lips, tongue normal; teeth and gums normal, normal posterior oropharynx  Neck: Supple, symmetrical  Lungs:   CTAB, respirations unlabored, no coughing  Heart:  RRR, no murmur, Appears well perfused  Extremities: No edema  Skin: Skin color, texture, turgor normal, no rashes or lesions on visualized portions of skin  Neurologic: No gross deficits   Assessment:  Chronic idiopathic urticaria -  In this case, without any obvious triggers, history most consistent with chronic idiopathic urticaria which is an intrinsic process and  typically not resultant of an underlying allergy.  Considered food/medication allergies, but these are usually obvious, occur within 2 hours of consumption, and resolve within 24 hours.  They also recur with every subsequent exposure.  We also discussed that environmental allergies rarely cause urticaria, and again, trigger is usually obvious.  Other considerations unlikely based on history include vasculitic urticaria or autoimmune disease, but given lack of associated symptoms/signs, no indication to pursue work-up for either.  Will screen for these for reassurance.   Other seasonal allergic rhinitis -  History and previous labs consistent with seasonal allergic rhinitis.  Will order testing for perennial allergens as well to help guide future management.  Discussed maximizing medical management at today's visit.  Food Allergy:  Previous testing was positive to peanuts, tree nuts, sesame and  soy. Based on history, has no prior reaction, but does not like peanuts and has not eaten in many years.  Has never tried tree nuts or sesame.  Have to assume true food allergy based on this history.  Could consider oral challenge in the future if she was interested, but today did not express interest.  Would get component panel prior to oral challenge if ever desired. She eats soy on a regular basis and denies consistent symptoms of any type.  This is likely false positive.  Can try avoiding to see if helps hives, but low suspicion this will be the case since she does not feel her symptoms occurred only on days she has eaten soy (and have occurred when not eating soy). Plan/Recommendations:   Patient Instructions  Chronic Idiopathic Urticaria: - this is defined as hives lasting more than 6 weeks without an identifiable trigger - hives can be from a number of different sources including infections, allergies, vibration, temperature, pressure among many others other possible causes - often an identifiable cause is not determined - some potential triggers include: stress, illness, NSAIDs, aspirin, hormonal changes - you do not have any red flag symptoms to make concerned about secondary causes of hives, but we will screen for these for reassurance with: CBC w diff, CMP, tryptase, TSH, alpha-gal panel, inflammatory markers - no avoidance of any specific foods/soaps/etc  is recommended with exception of treenuts and peanuts based on your previous testing (do not feel this is related to your hives based on history, but you may be allergic) - this type of hives can last for months to years (~3-5 years on average) and is considered benign - approximately 50% of patients with chronic hives can have some associated swelling of the face/lips/eyelids (this is not a cause for alarm and does not typically progress onto systemic allergic reactions) Therapy Plan:  - start zyrtec (cetirizine) 10mg  once daily - if  hives are uncontrolled, increase zyrtec (cetirizine) to 10mg  twice daily - if hives remain uncontrolled, increase dose of zyrtec (cetirizine) to max dose of 20mg  (2 pills) twice daily- this is maximum dose - can increase or decrease dosing depending on symptom control to a maximum dose of 4 tablets of antihistamine daily. Wait until hives free for at least one month prior to decreasing dose.   - if hives are still uncontrolled with the above regimen, please arrange an appointment for discussion of Xolair (omalizumab)- an injectable medication for hives  Can use one of the following in place of zyrtec if desires: Claritin (loratadine) 10 mg, Xyzal (levocetirizine) 5 mg or Allegra (fexofenadine) 180 mg daily as needed  Food Allergy:  - your prior food allergy testing was positive to peanuts, tree nuts, soy and sesame (positive is a result greater than 0.35) - based on your history today, I would advise strict avoidance of peanuts, treenuts and sesame as you may truly be allergic - since you are eating and tolerating soy, would consider this a false positive, would not expect this to be the cause of your hives unless you are able to confirm that you only have these hives on days you eat soy..in which case you should also avoid soy (this did not seem to be the case today) IN CASE OF ACCIDENTAL EXPOSURE - for SKIN only reaction, okay to take Benadryl 2 capsules every 4 hours - for SKIN + ANY additional symptoms, OR IF concern for LIFE THREATENING reaction = Epipen Autoinjector EpiPen 0.3 mg. - If using Epinephrine autoinjector, call 911 - A food allergy action plan has been provided and discussed. - Medic Alert identification is recommended.  Allergic Rhinitis: - Start Flonase (fluticasone) 1- 2 sprays in each nostril daily.  You will only get the full benefit of this medication by using it every day. - Start Astelin (Azelastine) 2 sprays in each nostril twice a day as needed.  You may use this as  needed for nasal congestion/itchy ears if desired   - Start Pataday (Olopatadine) 1 drop in each eye daily as needed for itchy/watery eyes. This is over the counter and likely not covered by your insurance. Please go to the following website to check for coupons:  (website to sign up for pataday coupon) - Continue over the counter antihistamine.  Your options include Zyrtec (Cetirizine) 10mg , Claritin (Loratadine) 10mg , Allegra (Fexofenadine) 180mg , or Xyzal (Levocetirinze) 5mg  daily. - will get a more expanded environmental panel today to get a better picture of what all is causing your symptoms  Xyzal samples provided today.  Follow-up in 8-12 weeks for hives control.  This note in its entirety was forwarded to the Provider who requested this consultation.  Thank you for your kind referral. I appreciate the opportunity to take part in Kyliegh's care. Please do not hesitate to contact me with questions.  Sincerely,  , MD [facility]

## 2021-09-06 ENCOUNTER — Telehealth: Payer: Self-pay | Admitting: Internal Medicine

## 2021-09-06 NOTE — Telephone Encounter (Signed)
Attempted to call patient, but VM not set-up and no answer.  My Chart Message sent instead.

## 2021-09-10 LAB — ALLERGENS, ZONE 2
Alternaria Alternata IgE: <0.1 kU/L
Amer Sycamore IgE Qn: 2.69 kU/L — AB
Aspergillus Fumigatus IgE: <0.1 kU/L
Bahia Grass IgE: 9.01 kU/L — AB
Bermuda Grass IgE: 3.8 kU/L — AB
Cat Dander IgE: <0.1 kU/L
Cedar, Mountain IgE: 0.11 kU/L — AB
Cladosporium Herbarum IgE: <0.1 kU/L
Cockroach, American IgE: <0.1 kU/L
Common Silver Birch IgE: 0.34 kU/L — AB
D Farinae IgE: 0.31 kU/L — AB
D Pteronyssinus IgE: 0.34 kU/L — AB
Dog Dander IgE: <0.1 kU/L
Elm, American IgE: 4.77 kU/L — AB
Hickory, White IgE: 15.7 kU/L — AB
Johnson Grass IgE: 1.95 kU/L — AB
Maple/Box Elder IgE: 1.45 kU/L — AB
Mucor Racemosus IgE: <0.1 kU/L
Mugwort IgE Qn: 5.12 kU/L — AB
Nettle IgE: 1.79 kU/L — AB
Oak, White IgE: 0.26 kU/L — AB
Penicillium Chrysogen IgE: <0.1 kU/L
Pigweed, Rough IgE: 0.18 kU/L — AB
Plantain, English IgE: 0.47 kU/L — AB
Ragweed, Short IgE: 0.32 kU/L — AB
Sheep Sorrel IgE Qn: 0.27 kU/L — AB
Stemphylium Herbarum IgE: <0.1 kU/L
Sweet gum IgE RAST Ql: 0.82 kU/L — AB
Timothy Grass IgE: 5.72 kU/L — AB
White Mulberry IgE: 0.37 kU/L — AB

## 2021-09-10 LAB — ALPHA-GAL PANEL
Allergen Lamb IgE: <0.1 kU/L
Beef IgE: <0.1 kU/L
IgE (Immunoglobulin E), Serum: 94 IU/mL (ref 6–495)
O215-IgE Alpha-Gal: <0.1 kU/L
Pork IgE: <0.1 kU/L

## 2021-09-10 LAB — SEDIMENTATION RATE: Sed Rate: 17 mm/hr (ref 0–32)

## 2021-09-10 LAB — CBC WITH DIFFERENTIAL/PLATELET
Basophils Absolute: 0 10*3/uL (ref 0.0–0.2)
Basos: 1 %
EOS (ABSOLUTE): 0.2 10*3/uL (ref 0.0–0.4)
Eos: 9 %
Hematocrit: 39.1 % (ref 34.0–46.6)
Hemoglobin: 13.2 g/dL (ref 11.1–15.9)
Immature Grans (Abs): 0 10*3/uL (ref 0.0–0.1)
Immature Granulocytes: 0 %
Lymphocytes Absolute: 0.7 10*3/uL (ref 0.7–3.1)
Lymphs: 34 %
MCH: 29.5 pg (ref 26.6–33.0)
MCHC: 33.8 g/dL (ref 31.5–35.7)
MCV: 87 fL (ref 79–97)
Monocytes Absolute: 0.3 10*3/uL (ref 0.1–0.9)
Monocytes: 13 %
Neutrophils Absolute: 0.9 10*3/uL — ABNORMAL LOW (ref 1.4–7.0)
Neutrophils: 43 %
Platelets: 284 10*3/uL (ref 150–450)
RBC: 4.48 x10E6/uL (ref 3.77–5.28)
RDW: 12.4 % (ref 11.7–15.4)
WBC: 2.1 10*3/uL — CL (ref 3.4–10.8)

## 2021-09-10 LAB — CMP14+EGFR
ALT: 10 IU/L (ref 0–32)
AST: 17 IU/L (ref 0–40)
Albumin/Globulin Ratio: 1.7 (ref 1.2–2.2)
Albumin: 4.7 g/dL (ref 3.9–5.0)
Alkaline Phosphatase: 77 IU/L (ref 42–106)
BUN/Creatinine Ratio: 12 (ref 9–23)
BUN: 8 mg/dL (ref 6–20)
Bilirubin Total: <0.2 mg/dL (ref 0.0–1.2)
CO2: 20 mmol/L (ref 20–29)
Calcium: 9.5 mg/dL (ref 8.7–10.2)
Chloride: 103 mmol/L (ref 96–106)
Creatinine, Ser: 0.65 mg/dL (ref 0.57–1.00)
Globulin, Total: 2.8 g/dL (ref 1.5–4.5)
Glucose: 90 mg/dL (ref 65–99)
Potassium: 3.9 mmol/L (ref 3.5–5.2)
Sodium: 139 mmol/L (ref 134–144)
Total Protein: 7.5 g/dL (ref 6.0–8.5)
eGFR: 130 mL/min/{1.73_m2} (ref >59)

## 2021-09-10 LAB — TSH+FREE T4
Free T4: 1.22 ng/dL (ref 0.93–1.60)
TSH: 1.34 u[IU]/mL (ref 0.450–4.500)

## 2021-09-10 LAB — TRYPTASE: Tryptase: 5 ug/L (ref 2.2–13.2)

## 2021-09-10 NOTE — Progress Notes (Signed)
Good morning! Could someone please let Tanya Burnett know that all of her labs are back.  She is not allergic to red meat/mammalian meat.  She does have environmental allergies to grasses, trees, weeds, and borderline to dust mites.  Her other labs were reassuring and do not show any secondary causes for her rash.    Additionally, I have already called (no answer, couldn't leave message) and sent a My Chart message regarding her WBC and neutrophil count.  This looks like a longstanding problem for her, but I will want to repeat this lab at our upcoming visit.   Thanks!

## 2021-10-05 ENCOUNTER — Ambulatory Visit (INDEPENDENT_AMBULATORY_CARE_PROVIDER_SITE_OTHER): Payer: BC Managed Care – PPO | Admitting: Primary Care

## 2021-10-05 ENCOUNTER — Other Ambulatory Visit: Payer: Self-pay

## 2021-10-05 ENCOUNTER — Encounter: Payer: Self-pay | Admitting: Primary Care

## 2021-10-05 VITALS — BP 110/62 | HR 78 | Temp 97.6°F | Ht 62.0 in | Wt 121.0 lb

## 2021-10-05 DIAGNOSIS — L509 Urticaria, unspecified: Secondary | ICD-10-CM

## 2021-10-05 DIAGNOSIS — R8290 Unspecified abnormal findings in urine: Secondary | ICD-10-CM

## 2021-10-05 DIAGNOSIS — R519 Headache, unspecified: Secondary | ICD-10-CM

## 2021-10-05 DIAGNOSIS — F32A Depression, unspecified: Secondary | ICD-10-CM

## 2021-10-05 DIAGNOSIS — T7800XA Anaphylactic reaction due to unspecified food, initial encounter: Secondary | ICD-10-CM

## 2021-10-05 DIAGNOSIS — Z0000 Encounter for general adult medical examination without abnormal findings: Secondary | ICD-10-CM

## 2021-10-05 DIAGNOSIS — F419 Anxiety disorder, unspecified: Secondary | ICD-10-CM

## 2021-10-05 HISTORY — DX: Unspecified abnormal findings in urine: R82.90

## 2021-10-05 LAB — POC URINALSYSI DIPSTICK (AUTOMATED)
Bilirubin, UA: NEGATIVE
Glucose, UA: POSITIVE — AB
Ketones, UA: NEGATIVE
Leukocytes, UA: NEGATIVE
Nitrite, UA: POSITIVE
Protein, UA: POSITIVE — AB
Spec Grav, UA: 1.015 (ref 1.010–1.025)
Urobilinogen, UA: 1 E.U./dL
pH, UA: 6.5 (ref 5.0–8.0)

## 2021-10-05 MED ORDER — ESCITALOPRAM OXALATE 5 MG PO TABS: 5.0000 mg | ORAL_TABLET | Freq: Every day | ORAL | 1 refills | Status: DC

## 2021-10-05 MED ORDER — AMITRIPTYLINE HCL 10 MG PO TABS: 10.0000 mg | ORAL_TABLET | Freq: Every day | ORAL | 3 refills | Status: DC

## 2021-10-05 NOTE — Patient Instructions (Addendum)
Start either the escitalopram (Lexapro) 5 mg for anxiety and depression or the amitriptyline 10 mg for headache prevention. Wait two weeks, then start the other medication.   You must take these medications everyday in order for them to be effective.  We will be in touch with lab results.   It was a pleasure to see you today!  Preventive Care 20-20 Years Old, Female Preventive care refers to lifestyle choices and visits with your health care provider that can promote health and wellness. This includes: A yearly physical exam. This is also called an annual wellness visit. Regular dental and eye exams. Immunizations. Screening for certain conditions. Healthy lifestyle choices, such as: Eating a healthy diet. Getting regular exercise. Not using drugs or products that contain nicotine and tobacco. Limiting alcohol use. What can I expect for my preventive care visit? Physical exam Your health care provider may check your: Height and weight. These may be used to calculate your BMI (body mass index). BMI is a measurement that tells if you are at a healthy weight. Heart rate and blood pressure. Body temperature. Skin for abnormal spots. Counseling Your health care provider may ask you questions about your: Past medical problems. Family's medical history. Alcohol, tobacco, and drug use. Emotional well-being. Home life and relationship well-being. Sexual activity. Diet, exercise, and sleep habits. Work and work Statistician. Access to firearms. Method of birth control. Menstrual cycle. Pregnancy history. What immunizations do I need? Vaccines are usually given at various ages, according to a schedule. Your health care provider will recommend vaccines for you based on your age, medical history, and lifestyle or other factors, such as travel or where you work. What tests do I need? Blood tests Lipid and cholesterol levels. These may be checked every 5 years starting at age  20. Hepatitis C test. Hepatitis B test. Screening Diabetes screening. This is done by checking your blood sugar (glucose) after you have not eaten for a while (fasting). STD (sexually transmitted disease) testing, if you are at risk. BRCA-related cancer screening. This may be done if you have a family history of breast, ovarian, tubal, or peritoneal cancers. Pelvic exam and Pap test. This may be done every 3 years starting at age 110. Starting at age 40, this may be done every 5 years if you have a Pap test in combination with an HPV test. Talk with your health care provider about your test results, treatment options, and if necessary, the need for more tests. Follow these instructions at home: Eating and drinking  Eat a healthy diet that includes fresh fruits and vegetables, whole grains, lean protein, and low-fat dairy products. Take vitamin and mineral supplements as recommended by your health care provider. Do not drink alcohol if: Your health care provider tells you not to drink. You are pregnant, may be pregnant, or are planning to become pregnant. If you drink alcohol: Limit how much you have to 0-1 drink a day. Be aware of how much alcohol is in your drink. In the [place] one drink equals one 12 oz bottle of beer (355 mL), one 5 oz glass of wine (148 mL), or one 1 oz glass of hard liquor (44 mL). Lifestyle Take daily care of your teeth and gums. Brush your teeth every morning and night with fluoride toothpaste. Floss one time each day. Stay active. Exercise for at least 30 minutes 5 or more days each week. Do not use any products that contain nicotine or tobacco, such as cigarettes, e-cigarettes, and chewing  tobacco. If you need help quitting, ask your health care provider. Do not use drugs. If you are sexually active, practice safe sex. Use a condom or other form of protection to prevent STIs (sexually transmitted infections). If you do not wish to become pregnant, use a form of  birth control. If you plan to become pregnant, see your health care provider for a prepregnancy visit. Find healthy ways to cope with stress, such as: Meditation, yoga, or listening to music. Journaling. Talking to a trusted person. Spending time with friends and family. Safety Always wear your seat belt while driving or riding in a vehicle. Do not drive: If you have been drinking alcohol. Do not ride with someone who has been drinking. When you are tired or distracted. While texting. Wear a helmet and other protective equipment during sports activities. If you have firearms in your house, make sure you follow all gun safety procedures. Seek help if you have been physically or sexually abused. What's next? Go to your health care provider once a year for an annual wellness visit. Ask your health care provider how often you should have your eyes and teeth checked. Stay up to date on all vaccines. This information is not intended to replace advice given to you by your health care provider. Make sure you discuss any questions you have with your health care provider. Document Revised: 02/16/2021 Document Reviewed: 08/20/2018 Elsevier Patient Education  2022 [employer].

## 2021-10-05 NOTE — Assessment & Plan Note (Signed)
Labs returning positive for peanut allergy.  Following with allergist.

## 2021-10-05 NOTE — Assessment & Plan Note (Signed)
Allergy panel positive for peanut allergy mostly. Following with allergist.   Taking daily antihistamine.   She is working to avoid peanut products and peanut oils.

## 2021-10-05 NOTE — Assessment & Plan Note (Signed)
Immunizations UTD.  Encouraged a healthy diet. Discussed safety for this age range.  Depression screening positive, chronic for years. Will treat.  Exam today stable. Labs pending.

## 2021-10-05 NOTE — Progress Notes (Signed)
Subjective:    Patient ID: Tanya Burnett, female    DOB: Jul 30, 2001, 20 y.o.   MRN: [id number]  HPI  Tanya Burnett is a very pleasant 20 y.o. female who presents today for complete physical and follow up of chronic conditions.  She continues to notice frequent headaches. She has not been taking amitriptyline 10 mg as she "ran out". She did notice improvement in headaches when taking amitriptyline, plans on resuming.  Chronic symptoms of depression and anxiety for years. Symptoms include worry, feeling down/sad, irritability, difficulty sleeping. She's never been treated for anxiety or depression. Both brothers are incarcerated.   She's also noticed foul smelling urine, 2 weeks ago. Is sexually active. She denies dysuria, hematuria, vaginal discharge, vaginal itching.   PHQ9 SCORE ONLY 10/05/2021 09/07/2020  PHQ-9 Total Score 6 4   GAD 7 : Generalized Anxiety Score 10/05/2021  Nervous, Anxious, on Edge 3  Control/stop worrying 2  Worry too much - different things 3  Trouble relaxing 3  Restless 3  Easily annoyed or irritable 3  Afraid - awful might happen 2  Total GAD 7 Score 19  Anxiety Difficulty Somewhat difficult      Immunizations: -Tetanus: 2014 -Influenza: Declines  -Covid-19: No vaccines -HPV: Completed  Diet: Fair diet.  Exercise: No regular exercise.  Eye exam: Completes annually  Dental exam: Completes semi-annually   BP Readings from Last 3 Encounters:  10/05/21 110/62  09/05/21 118/72  08/29/21 118/64   Wt Readings from Last 3 Encounters:  10/05/21 121 lb (54.9 kg) (36 %, Z= -0.37)*  09/05/21 124 lb (56.2 kg) (42 %, Z= -0.21)*  08/29/21 122 lb (55.3 kg) (38 %, Z= -0.31)*   * Growth percentiles are based on CDC (Girls, 2-20 Years) data.       Review of Systems  Constitutional:  Negative for unexpected weight change.  HENT:  Negative for rhinorrhea.   Eyes:  Negative for visual disturbance.  Respiratory:  Negative for cough and shortness of  breath.   Cardiovascular:  Negative for chest pain.  Gastrointestinal:  Negative for constipation and diarrhea.  Genitourinary:  Negative for difficulty urinating, dysuria, frequency, hematuria and vaginal discharge.       Foul smelling urine  Musculoskeletal:  Negative for arthralgias and myalgias.  Skin:  Negative for rash.  Allergic/Immunologic: Positive for environmental allergies and food allergies.  Neurological:  Positive for headaches. Negative for dizziness.  Psychiatric/Behavioral:         Depression and anxiety, see HPI        Past Medical History:  Diagnosis Date   Frequent headaches    Syncope     Social History   Socioeconomic History   Marital status: Single    Spouse name: Not on file   Number of children: Not on file   Years of education: Not on file   Highest education level: Not on file  Occupational History   Not on file  Tobacco Use   Smoking status: Never   Smokeless tobacco: Never  Vaping Use   Vaping Use: Never used  Substance and Sexual Activity   Alcohol use: Never   Drug use: Never   Sexual activity: Not on file  Other Topics Concern   Not on file  Social History Narrative   Not on file   Social Determinants of Health   Financial Resource Strain: Not on file  Food Insecurity: Not on file  Transportation Needs: Not on file  Physical Activity: Not on file  Stress:  Not on file  Social Connections: Not on file  Intimate Partner Violence: Not on file    History reviewed. No pertinent surgical history.  Family History  Problem Relation Age of Onset   Depression Mother    Depression Father    Alcohol abuse Father     Allergies  Allergen Reactions   Almond (Diagnostic)    Black Walnut Flavor    Hazelnut Product/process development scientist) Allergy Skin Test    Peanut (Diagnostic)    Sesame Seed (Diagnostic)     Current Outpatient Medications on File Prior to Visit  Medication Sig Dispense Refill   cetirizine (ZYRTEC) 10 MG tablet Take 1 tablet (10  mg total) by mouth daily. For allergies. 90 tablet 1   azelastine (ASTELIN) 0.1 % nasal spray Place 2 sprays into both nostrils 2 (two) times daily as needed for rhinitis. Use in each nostril as directed (Patient not taking: Reported on 10/05/2021) 30 mL 12   EPINEPHrine 0.3 mg/0.3 mL IJ SOAJ injection Inject 0.3 mg into the muscle as needed for anaphylaxis. (Patient not taking: Reported on 10/05/2021) 1 each 0   medroxyPROGESTERone (DEPO-PROVERA) 150 MG/ML injection Inject 1 mL (150 mg total) into the muscle every 3 (three) months. (Patient not taking: Reported on 10/05/2021) 1 mL 2   No current facility-administered medications on file prior to visit.    BP 110/62   Pulse 78   Temp 97.6 F (36.4 C) (Temporal)   Ht 5\' 2"  (1.575 m)   Wt 121 lb (54.9 kg)   LMP  (LMP Unknown)   SpO2 99%   BMI 22.13 kg/m  Objective:   Physical Exam HENT:     Right Ear: Tympanic membrane and ear canal normal.     Left Ear: Tympanic membrane and ear canal normal.     Nose: Nose normal.  Eyes:     Conjunctiva/sclera: Conjunctivae normal.     Pupils: Pupils are equal, round, and reactive to light.  Neck:     Thyroid: No thyromegaly.  Cardiovascular:     Rate and Rhythm: Normal rate and regular rhythm.     Heart sounds: No murmur heard. Pulmonary:     Effort: Pulmonary effort is normal.     Breath sounds: Normal breath sounds. No rales.  Abdominal:     General: Bowel sounds are normal.     Palpations: Abdomen is soft.     Tenderness: There is no abdominal tenderness.  Musculoskeletal:        General: Normal range of motion.     Cervical back: Neck supple.  Lymphadenopathy:     Cervical: No cervical adenopathy.  Skin:    General: Skin is warm and dry.     Findings: No rash.  Neurological:     Mental Status: She is alert and oriented to person, place, and time.     Cranial Nerves: No cranial nerve deficit.     Deep Tendon Reflexes: Reflexes are normal and symmetric.  Psychiatric:        Mood  and Affect: Mood normal.          Assessment & Plan:      This visit occurred during the SARS-CoV-2 public health emergency.  Safety protocols were in place, including screening questions prior to the visit, additional usage of staff PPE, and extensive cleaning of exam room while observing appropriate contact time as indicated for disinfecting solutions.

## 2021-10-05 NOTE — Assessment & Plan Note (Signed)
Chronic for years, worse over last year.  Discussed options for treatment. She declines therapy due to cost and time. She does agree to treatment with medication, is already on amitriptyline 10 mg for headache prevention.  Resume amitriptyline 10 mg. Start Lexapro 5 mg. Will allow a gap of 2 weeks in between medications.   We discussed possible side effects of headache, GI upset, drowsiness, and SI/HI. If thoughts of SI/HI develop, we discussed to present to the emergency immediately. Patient verbalized understanding.   Follow up in 6 weeks for re-evaluation.

## 2021-10-05 NOTE — Assessment & Plan Note (Signed)
Controlled on amitriptyline 10 mg but has not been taking. Will resend Rx to [facility] as this is more cost effective.  Resume amitriptyline 10 mg.

## 2021-10-05 NOTE — Assessment & Plan Note (Signed)
Acute for 2 weeks.  Checking gonorrhea, chlamydia, trichomonas, UA. Await results.

## 2021-10-09 ENCOUNTER — Other Ambulatory Visit: Payer: Self-pay | Admitting: Primary Care

## 2021-10-09 DIAGNOSIS — N3001 Acute cystitis with hematuria: Secondary | ICD-10-CM

## 2021-10-09 DIAGNOSIS — A749 Chlamydial infection, unspecified: Secondary | ICD-10-CM

## 2021-10-09 LAB — C. TRACHOMATIS/N. GONORRHOEAE RNA
C. trachomatis RNA, TMA: DETECTED — AB
N. gonorrhoeae RNA, TMA: NOT DETECTED

## 2021-10-09 LAB — URINE CULTURE
MICRO NUMBER:: 12504717
SPECIMEN QUALITY:: ADEQUATE

## 2021-10-09 LAB — TRICHOMONAS VAGINALIS, PROBE AMP: Trichomonas vaginalis RNA: NOT DETECTED

## 2021-10-09 MED ORDER — DOXYCYCLINE HYCLATE 100 MG PO CAPS: 100.0000 mg | ORAL_CAPSULE | Freq: Two times a day (BID) | ORAL | 0 refills | Status: DC

## 2021-10-09 MED ORDER — NITROFURANTOIN MONOHYD MACRO 100 MG PO CAPS: 100.0000 mg | ORAL_CAPSULE | Freq: Two times a day (BID) | ORAL | 0 refills | Status: AC

## 2021-11-06 NOTE — Progress Notes (Deleted)
FOLLOW UP Date of Service/Encounter:  11/06/21   Subjective:  Tanya Burnett (DOB: 2001-07-05) is a 20 y.o. female who [facility] in re-evaluation of the following: *** History obtained from: chart review and {Persons; PED relatives w/patient:19415::"patient"}.  For Review, LV was on 09/05/21  with Dr.Britain Saber seen for chronic idiopathic urticaria with angioedema, seasonal allergic rhinitis and food allergy (no prior reactions, previous testing + peanut, tree nuts, sesame, soy; eating soy on a regular basis-considered false positive).   Initial visit-we started zyrtec 10-40 mg / day dosing for hives; provided epipen for assumed food allergy (peanuts, tree nuts, sesame), and started flonase, pataday for ARC  Today presents for follow-up.   Previous diagnostics:  IgE testing: 08/24/21- + to grasses, trees.  Negative to molds.   Food allergy profile was positive to peanut, walnut, soy, sesame, hazelnut, almond.   AEC 200 (09/05/21) Tryptase baseline normal (5.0) TSH, fT4 within normal limits Alpha gal panel-negative Total IgE 94 CBCd with leukocytosis (chronic) Zone 2 Environmental panel: + grasses, trees, weeds. Negative to molds, cat, dog, cockroach, ragweed Allergies as of 11/07/2021       Reactions   Almond (diagnostic)    Black Walnut Flavor    Hazelnut (filbert) Allergy Skin Test    Peanut (diagnostic)    Sesame Seed (diagnostic)         Medication List        Accurate as of November 06, 2021 12:53 PM. If you have any questions, ask your nurse or doctor.          amitriptyline 10 MG tablet Commonly known as: ELAVIL Take 1 tablet (10 mg total) by mouth at bedtime. For headache prevention.   azelastine 0.1 % nasal spray Commonly known as: ASTELIN Place 2 sprays into both nostrils 2 (two) times daily as needed for rhinitis. Use in each nostril as directed   cetirizine 10 MG tablet Commonly known as: ZYRTEC Take 1  tablet (10 mg total) by mouth daily. For allergies.   doxycycline 100 MG capsule Commonly known as: VIBRAMYCIN Take 1 capsule (100 mg total) by mouth 2 (two) times daily. For chlamydia.   EPINEPHrine 0.3 mg/0.3 mL Soaj injection Commonly known as: EPI-PEN Inject 0.3 mg into the muscle as needed for anaphylaxis.   escitalopram 5 MG tablet Commonly known as: Lexapro Take 1 tablet (5 mg total) by mouth daily. For anxiety and depression   medroxyPROGESTERone 150 MG/ML injection Commonly known as: Depo-Provera Inject 1 mL (150 mg total) into the muscle every 3 (three) months.       Past Medical History:  Diagnosis Date   Frequent headaches    Syncope    No past surgical history on file. Otherwise, there have been no changes to her past medical history, surgical history, family history, or social history.  ROS: All others negative except as noted per HPI.   Objective:  There were no vitals taken for this visit. There is no height or weight on file to calculate BMI. Physical Exam: General Appearance:  Alert, cooperative, no distress, appears stated age  Head:  Normocephalic, without obvious abnormality, atraumatic  Eyes:  Conjunctiva clear, EOM's intact  Nose: Nares normal  Throat: Lips, tongue normal; teeth and gums normal  Neck: Supple, symmetrical  Lungs:   Respirations unlabored, no coughing  Heart:  Appears well perfused  Extremities: No edema  Skin: Skin color, texture, turgor normal, no rashes or lesions on visualized portions of skin  Neurologic: No gross deficits   Reviewed: ***  Spirometry:  Tracings reviewed. Her effort: {Blank single:19197::"Good reproducible efforts.","It was hard to get consistent efforts and there is a question as to whether this reflects a maximal maneuver.","Poor effort, data can not be interpreted."} FVC: ***L FEV1: ***L, ***% predicted FEV1/FVC ratio: ***% Interpretation: {Blank single:19197::"Spirometry consistent with mild  obstructive disease","Spirometry consistent with moderate obstructive disease","Spirometry consistent with severe obstructive disease","Spirometry consistent with possible restrictive disease","Spirometry consistent with mixed obstructive and restrictive disease","Spirometry uninterpretable due to technique","Spirometry consistent with normal pattern","No overt abnormalities noted given today's efforts"}.  Please see scanned spirometry results for details.  Skin Testing: {Blank single:19197::"Select foods","Environmental allergy panel","Environmental allergy panel and select foods","Food allergy panel","None","Deferred due to recent antihistamines use"}. Positive test to: ***. Negative test to: ***.  Results discussed with patient/family.   {Blank single:19197::"Allergy testing results were read and interpreted by myself, documented by clinical staff."," "}  Assessment/Plan  There are no Patient Instructions on file for this visit.  No follow-ups on file.  Tonny Bollman, MD  [facility]

## 2021-11-07 ENCOUNTER — Ambulatory Visit: Payer: BC Managed Care – PPO | Admitting: Internal Medicine

## 2021-11-07 DIAGNOSIS — J309 Allergic rhinitis, unspecified: Secondary | ICD-10-CM

## 2021-11-20 ENCOUNTER — Telehealth (INDEPENDENT_AMBULATORY_CARE_PROVIDER_SITE_OTHER): Payer: Self-pay | Admitting: Primary Care

## 2021-11-20 ENCOUNTER — Encounter: Payer: Self-pay | Admitting: Primary Care

## 2021-11-20 ENCOUNTER — Other Ambulatory Visit: Payer: Self-pay

## 2021-11-20 VITALS — Ht 62.0 in | Wt 121.0 lb

## 2021-11-20 DIAGNOSIS — Z3009 Encounter for other general counseling and advice on contraception: Secondary | ICD-10-CM | POA: Insufficient documentation

## 2021-11-20 DIAGNOSIS — F32A Depression, unspecified: Secondary | ICD-10-CM

## 2021-11-20 DIAGNOSIS — Z30011 Encounter for initial prescription of contraceptive pills: Secondary | ICD-10-CM

## 2021-11-20 DIAGNOSIS — R519 Headache, unspecified: Secondary | ICD-10-CM

## 2021-11-20 DIAGNOSIS — F419 Anxiety disorder, unspecified: Secondary | ICD-10-CM

## 2021-11-20 MED ORDER — ESCITALOPRAM OXALATE 5 MG PO TABS: 5.0000 mg | ORAL_TABLET | Freq: Every day | ORAL | 3 refills | Status: DC

## 2021-11-20 NOTE — Patient Instructions (Signed)
  Continue Lexapro 5 mg for depression and anxiety. Continue amitriptyline 10 mg for headache prevention.   Go to [facility] for the urine pregnancy test. I will send in your birth control pills once I have results.   It was a pleasure to see you today!

## 2021-11-20 NOTE — Progress Notes (Signed)
Patient ID: Tanya Burnett, female    DOB: [birth year], 20 y.o.   MRN: [id number]  Virtual visit completed through [employer], a video enabled telemedicine application. Due to national recommendations of social distancing due to COVID-19, a virtual visit is felt to be most appropriate for this patient at this time. Reviewed limitations, risks, security and privacy concerns of performing a virtual visit and the availability of in person appointments. I also reviewed that there may be a patient responsible charge related to this service. The patient agreed to proceed.   Patient location: home Provider location: [place] at [facility], office Persons participating in this virtual visit: patient, provider   If any vitals were documented, they were collected by patient at home unless specified below.    Ht 5\' 2"  (1.575 m)   Wt 121 lb (54.9 kg)   BMI 22.13 kg/m    CC: Follow Up Subjective:   HPI: Tanya Burnett is a 20 y.o. female with a history of frequent headaches, depression, anxiety presenting on 11/20/2021 for follow up of depression and anxiety, and to resume Depo Provera.   She was last evaluated on 10/05/21 for CPE and follow up of chronic conditions. During this visit she endorsed worsening chronic depression and anxiety with symptoms of worry, feeling down/sad, irritability, difficulty sleeping. She declined therapy so we initiated Lexapro 5 mg daily.   We also resumed her amitriptyline 10 mg for headache prevention.   Since her last visit she's compliant to Lexapro 5 mg daily and amitriptyline 10 mg. She has noticed more motivation to do things, has less worry, is less irritable. Her mother has noticed an improvement in symptoms as well. She's had no SI/HI, GI upset, nausea.   She's had no headaches since her last visit, is compliant to amitriptyline.   She would like to start birth control treatment for pregnancy prevention, is wanting OCP's. LMP was September 2022, did have  some spotting in October for one day. She's taken several urine pregnancy tests, last one a few weeks ago, all negative. Long history of irregular periods since she was initially placed on Depo Provera in 2020. Menses occurs every other month on average.   She is sexually active, last encounter was in August 2022, did not use condoms or birth control.   She is needing a work note for this office visit.    fDepression and Contraception (Has not had depo in a very long time wanted to start something for it. )       Relevant past medical, surgical, family and social history reviewed and updated as indicated. Interim medical history since our last visit reviewed. Allergies and medications reviewed and updated. Outpatient Medications Prior to Visit  Medication Sig Dispense Refill   amitriptyline (ELAVIL) 10 MG tablet Take 1 tablet (10 mg total) by mouth at bedtime. For headache prevention. 90 tablet 3   cetirizine (ZYRTEC) 10 MG tablet Take 1 tablet (10 mg total) by mouth daily. For allergies. 90 tablet 1   escitalopram (LEXAPRO) 5 MG tablet Take 1 tablet (5 mg total) by mouth daily. For anxiety and depression 30 tablet 1   azelastine (ASTELIN) 0.1 % nasal spray Place 2 sprays into both nostrils 2 (two) times daily as needed for rhinitis. Use in each nostril as directed (Patient not taking: Reported on 10/05/2021) 30 mL 12   EPINEPHrine 0.3 mg/0.3 mL IJ SOAJ injection Inject 0.3 mg into the muscle as needed for anaphylaxis. (Patient not taking: Reported on 10/05/2021)  1 each 0   medroxyPROGESTERone (DEPO-PROVERA) 150 MG/ML injection Inject 1 mL (150 mg total) into the muscle every 3 (three) months. (Patient not taking: Reported on 10/05/2021) 1 mL 2   doxycycline (VIBRAMYCIN) 100 MG capsule Take 1 capsule (100 mg total) by mouth 2 (two) times daily. For chlamydia. 14 capsule 0   No facility-administered medications prior to visit.     Per HPI unless specifically indicated in ROS section  below Review of Systems Objective:  Ht 5\' 2"  (1.575 m)   Wt 121 lb (54.9 kg)   BMI 22.13 kg/m   Wt Readings from Last 3 Encounters:  11/20/21 121 lb (54.9 kg)  10/05/21 121 lb (54.9 kg) (36 %, Z= -0.37)*  09/05/21 124 lb (56.2 kg) (42 %, Z= -0.21)*   * Growth percentiles are based on CDC (Girls, 2-20 Years) data.       Physical exam: General: Alert and oriented x 3, no distress, does not appear sickly  Pulmonary: Speaks in complete sentences without increased work of breathing, no cough during visit.  Psychiatric: Normal mood, thought content, and behavior.     Results for orders placed or performed in visit on 10/05/21  C. trachomatis/N. gonorrhoeae RNA   Specimen: Urine  Result Value Ref Range   C. trachomatis RNA, TMA DETECTED (A) NOT DETECTED   N. gonorrhoeae RNA, TMA NOT DETECTED NOT DETECTED  Trichomonas vaginalis, RNA   Specimen: Urine  Result Value Ref Range   Trichomonas vaginalis RNA NOT DETECTED NOT DETECTED  Urine Culture   Specimen: Urine  Result Value Ref Range   MICRO NUMBER: 10/07/21    SPECIMEN QUALITY: Adequate    Sample Source URINE    STATUS: FINAL    ISOLATE 1: Escherichia coli (A)       Susceptibility   Escherichia coli - URINE CULTURE, REFLEX    AMOX/CLAVULANIC 4 Sensitive     AMPICILLIN >=32 Resistant     AMPICILLIN/SULBACTAM 16 Intermediate     CEFAZOLIN* <=4 Not Reportable      * For infections other than uncomplicated UTI caused by E. coli, K. pneumoniae or P. mirabilis: Cefazolin is resistant if MIC > or = 8 mcg/mL. (Distinguishing susceptible versus intermediate for isolates with MIC < or = 4 mcg/mL requires additional testing.) For uncomplicated UTI caused by E. coli, K. pneumoniae or P. mirabilis: Cefazolin is susceptible if MIC <32 mcg/mL and predicts susceptible to the oral agents cefaclor, cefdinir, cefpodoxime, cefprozil, cefuroxime, cephalexin and loracarbef.     CEFTAZIDIME <=1 Sensitive     CEFEPIME <=1 Sensitive      CEFTRIAXONE <=1 Sensitive     CIPROFLOXACIN <=0.25 Sensitive     LEVOFLOXACIN <=0.12 Sensitive     GENTAMICIN >=16 Resistant     IMIPENEM <=0.25 Sensitive     NITROFURANTOIN <=16 Sensitive     PIP/TAZO <=4 Sensitive     TOBRAMYCIN 8 Intermediate     TRIMETH/SULFA* >=320 Resistant      * For infections other than uncomplicated UTI caused by E. coli, K. pneumoniae or P. mirabilis: Cefazolin is resistant if MIC > or = 8 mcg/mL. (Distinguishing susceptible versus intermediate for isolates with MIC < or = 4 mcg/mL requires additional testing.) For uncomplicated UTI caused by E. coli, K. pneumoniae or P. mirabilis: Cefazolin is susceptible if MIC <32 mcg/mL and predicts susceptible to the oral agents cefaclor, cefdinir, cefpodoxime, cefprozil, cefuroxime, cephalexin and loracarbef. Legend: S = Susceptible  I = Intermediate R = Resistant  NS = Not  susceptible * = Not tested  NR = Not reported **NN = See antimicrobic comments   POCT Urinalysis Dipstick (Automated)  Result Value Ref Range   Color, UA yellow    Clarity, UA cloudy    Glucose, UA Positive (A) Negative   Bilirubin, UA neg    Ketones, UA neg    Spec Grav, UA 1.015 1.010 - 1.025   Blood, UA Trace    pH, UA 6.5 5.0 - 8.0   Protein, UA Positive (A) Negative   Urobilinogen, UA 1.0 0.2 or 1.0 E.U./dL   Nitrite, UA pos    Leukocytes, UA Negative Negative   Assessment & Plan:   Problem List Items Addressed This Visit       Other   Anxiety and depression   Relevant Medications   escitalopram (LEXAPRO) 5 MG tablet   Other Visit Diagnoses     Encounter for initial prescription of contraceptive pills    -  Primary   Relevant Orders   Pregnancy, urine        Meds ordered this encounter  Medications   escitalopram (LEXAPRO) 5 MG tablet    Sig: Take 1 tablet (5 mg total) by mouth daily. For anxiety and depression    Dispense:  90 tablet    Refill:  3    Order Specific Question:   Supervising Provider     Answer:   Ermalene Searing, AMY E [[id number]]   Orders Placed This Encounter  Procedures   Pregnancy, urine    I discussed the assessment and treatment plan with the patient. The patient was provided an opportunity to ask questions and all were answered. The patient agreed with the plan and demonstrated an understanding of the instructions. The patient was advised to call back or seek an in-person evaluation if the symptoms worsen or if the condition fails to improve as anticipated.  Follow up plan:  Continue Lexapro 5 mg for depression and anxiety. Continue amitriptyline 10 mg for headache prevention.   Go to [facility] for the urine pregnancy test. I will send in your birth control pills once I have results.   It was a pleasure to see you today!   Doreene Nest, NP

## 2021-11-20 NOTE — Assessment & Plan Note (Signed)
History of irregular menses. Will check UPT per [facility]. She has no insurance an cannot afford expensive testing.  Once UPT is negative we will send in OCP's for treatment. Await results.

## 2021-11-20 NOTE — Assessment & Plan Note (Signed)
Improving with Lexapro 5 mg, continue same. Refills sent to pharmacy.

## 2021-11-20 NOTE — Assessment & Plan Note (Signed)
Resolved since re-initiation of amitriptyline 10 mg. Continue same.

## 2022-01-16 ENCOUNTER — Ambulatory Visit: Payer: Self-pay | Admitting: Primary Care

## 2022-03-20 DIAGNOSIS — Z3009 Encounter for other general counseling and advice on contraception: Secondary | ICD-10-CM

## 2022-03-22 ENCOUNTER — Encounter: Payer: Self-pay | Admitting: Primary Care

## 2022-03-22 ENCOUNTER — Ambulatory Visit (INDEPENDENT_AMBULATORY_CARE_PROVIDER_SITE_OTHER): Payer: Self-pay | Admitting: Primary Care

## 2022-03-22 VITALS — BP 100/58 | HR 115 | Temp 99.8°F | Ht 62.0 in | Wt 122.0 lb

## 2022-03-22 DIAGNOSIS — R8290 Unspecified abnormal findings in urine: Secondary | ICD-10-CM

## 2022-03-22 DIAGNOSIS — F32A Depression, unspecified: Secondary | ICD-10-CM

## 2022-03-22 DIAGNOSIS — Z113 Encounter for screening for infections with a predominantly sexual mode of transmission: Secondary | ICD-10-CM

## 2022-03-22 DIAGNOSIS — Z3009 Encounter for other general counseling and advice on contraception: Secondary | ICD-10-CM

## 2022-03-22 DIAGNOSIS — F419 Anxiety disorder, unspecified: Secondary | ICD-10-CM

## 2022-03-22 DIAGNOSIS — R519 Headache, unspecified: Secondary | ICD-10-CM

## 2022-03-22 LAB — POC URINALSYSI DIPSTICK (AUTOMATED)
Bilirubin, UA: NEGATIVE
Blood, UA: NEGATIVE
Glucose, UA: NEGATIVE
Ketones, UA: NEGATIVE
Nitrite, UA: NEGATIVE
Protein, UA: POSITIVE — AB
Spec Grav, UA: 1.025 (ref 1.010–1.025)
Urobilinogen, UA: 0.2 E.U./dL
pH, UA: 6 (ref 5.0–8.0)

## 2022-03-22 MED ORDER — PROPRANOLOL HCL ER 80 MG PO CP24: 80.0000 mg | ORAL_CAPSULE | Freq: Every day | ORAL | 0 refills | Status: DC

## 2022-03-22 NOTE — Assessment & Plan Note (Signed)
She prefers the Nexplanon implant. ? ?We have referred her to GYN in [place], we have also discussed use of the health department. ? ? ?

## 2022-03-22 NOTE — Progress Notes (Signed)
? ?Subjective:  ? ? Patient ID: Tanya Burnett, female    DOB: 31-Dec-2000, 21 y.o.   MRN: [id number] ? ?HPI ? ?Tanya Burnett is a very pleasant 21 y.o. female with a history of leukopenia, proteinuria, idiopathic urticaria, food allergies who presents today to discuss birth control options, for follow up of headaches, and for STD testing. ? ?1) Birth Control: Historically managed on Depo-Provera 150 mg every 3 months.  Compliance has been intermittent overall, last injection was about one year ago.  She lives in [place]. She is not using anything for birth control.  ? ?2) STD Testing: Sexually active with one partner who has noticed penile itching. She and her partner do not use condoms. She's noticed a foul smelling odor to urine about one week ago.  ? ?She denies hematuria, vaginal itching, vaginal discharge.  ? ?3) Frequent Headaches: Non compliant to her amitriptyline as it causes drowsiness the following day. This is the first she's ever mentioned this. She did notice improvement in her headaches when on amitriptyline consistently.  ? ?She was once prescribed propranolol ER 80 mg, doesn't recall  ? ?4) GAD: Chronic and ongoing, infrequent use of her Lexapro which does help with symptoms if she takes it consistently. She's not sure why she isn't taking this daily.  ? ?Review of Systems  ?Genitourinary:  Negative for dysuria, frequency, pelvic pain and vaginal discharge.  ?     Foul smelling urine  ?Neurological:  Positive for headaches.  ?Psychiatric/Behavioral:  The patient is nervous/anxious.   ? ?   ? ? ?Past Medical History:  ?Diagnosis Date  ? Foul smelling urine 10/05/2021  ? Frequent headaches   ? Syncope   ? ? ?Social History  ? ?Socioeconomic History  ? Marital status: Single  ?  Spouse name: Not on file  ? Number of children: Not on file  ? Years of education: Not on file  ? Highest education level: Not on file  ?Occupational History  ? Not on file  ?Tobacco Use  ? Smoking status: Never  ?  Smokeless tobacco: Never  ?Vaping Use  ? Vaping Use: Never used  ?Substance and Sexual Activity  ? Alcohol use: Never  ? Drug use: Never  ? Sexual activity: Not on file  ?Other Topics Concern  ? Not on file  ?Social History Narrative  ? Not on file  ? ?Social Determinants of Health  ? ?Financial Resource Strain: Not on file  ?Food Insecurity: Not on file  ?Transportation Needs: Not on file  ?Physical Activity: Not on file  ?Stress: Not on file  ?Social Connections: Not on file  ?Intimate Partner Violence: Not on file  ? ? ?History reviewed. No pertinent surgical history. ? ?Family History  ?Problem Relation Age of Onset  ? Depression Mother   ? Depression Father   ? Alcohol abuse Father   ? ? ?Allergies  ?Allergen Reactions  ? Almond (Diagnostic)   ? Black [place]   ? Hazelnut [facility] Allergy Skin Test   ? Peanut (Diagnostic)   ? Sesame Seed (Diagnostic)   ? ? ?Current Outpatient Medications on File Prior to Visit  ?Medication Sig Dispense Refill  ? cetirizine (ZYRTEC) 10 MG tablet Take 1 tablet (10 mg total) by mouth daily. For allergies. 90 tablet 1  ? amitriptyline (ELAVIL) 10 MG tablet Take 1 tablet (10 mg total) by mouth at bedtime. For headache prevention. (Patient not taking: Reported on 03/22/2022) 90 tablet 3  ? azelastine (ASTELIN) 0.1 %  nasal spray Place 2 sprays into both nostrils 2 (two) times daily as needed for rhinitis. Use in each nostril as directed (Patient not taking: Reported on 10/05/2021) 30 mL 12  ? EPINEPHrine 0.3 mg/0.3 mL IJ SOAJ injection Inject 0.3 mg into the muscle as needed for anaphylaxis. (Patient not taking: Reported on 10/05/2021) 1 each 0  ? escitalopram (LEXAPRO) 5 MG tablet Take 1 tablet (5 mg total) by mouth daily. For anxiety and depression (Patient not taking: Reported on 03/22/2022) 90 tablet 3  ? medroxyPROGESTERone (DEPO-PROVERA) 150 MG/ML injection Inject 1 mL (150 mg total) into the muscle every 3 (three) months. (Patient not taking: Reported on 10/05/2021) 1 mL 2   ? ?No current facility-administered medications on file prior to visit.  ? ? ?BP (!) 100/58   Pulse (!) 115   Temp 99.8 ?F (37.7 ?C) (Oral)   Ht 5\' 2"  (1.575 m)   Wt 122 lb (55.3 kg)   SpO2 98%   BMI 22.31 kg/m?  ?Objective:  ? Physical Exam ?Cardiovascular:  ?   Rate and Rhythm: Normal rate and regular rhythm.  ?Pulmonary:  ?   Effort: Pulmonary effort is normal.  ?   Breath sounds: Normal breath sounds.  ?Musculoskeletal:  ?   Cervical back: Neck supple.  ?Skin: ?   General: Skin is warm and dry.  ? ? ? ? ? ?   ?Assessment & Plan:  ? ? ? ? ?This visit occurred during the SARS-CoV-2 public health emergency.  Safety protocols were in place, including screening questions prior to the visit, additional usage of staff PPE, and extensive cleaning of exam room while observing appropriate contact time as indicated for disinfecting solutions.  ?

## 2022-03-22 NOTE — Assessment & Plan Note (Signed)
Uncontrolled, also noncompliant to Lexapro 5 mg as prescribed. ? ?Long discussion about the need to consistently take her Lexapro 5 mg daily.  She agrees to start.  She does well when she is taking Lexapro daily. ? ?She has plenty of refills at her pharmacy, discussed this today. ?

## 2022-03-22 NOTE — Assessment & Plan Note (Addendum)
Labs pending today including HIV, Hep C, HSV, RPR, gonorrhea, chlamydia. ? ?UA today with very foul smell, culture pending. ? ?Strongly encouraged condom use when sexually active. ?

## 2022-03-22 NOTE — Assessment & Plan Note (Signed)
This is the first time hearing that amitriptyline has caused her to feel drowsy.  Strongly advise she notify me if she has side effects of medications. ? ?Discontinue amitriptyline 10 mg. ?Start propanolol ER 80 mg daily for headache prevention. ? ?She will update. ?

## 2022-03-22 NOTE — Patient Instructions (Signed)
Stop by the lab prior to leaving today. I will notify you of your results once received.  ? ?Start propranolol ER 80 mg daily for headache prevention. You should take this everyday.  ? ?Resume your escitalopram (Lexapro) 5 mg daily for anxiety.  This must be taken daily for you to see positive effects. ? ?Please schedule an appointment with the health department for the implant birth control. ? ?It was a pleasure to see you today! ? ? ?

## 2022-03-24 LAB — URINE CULTURE
MICRO NUMBER:: 13207568
SPECIMEN QUALITY:: ADEQUATE

## 2022-03-25 ENCOUNTER — Other Ambulatory Visit: Payer: Self-pay | Admitting: Primary Care

## 2022-03-25 DIAGNOSIS — A599 Trichomoniasis, unspecified: Secondary | ICD-10-CM

## 2022-03-25 MED ORDER — METRONIDAZOLE 500 MG PO TABS: 500.0000 mg | ORAL_TABLET | Freq: Two times a day (BID) | ORAL | 0 refills | Status: AC

## 2022-03-27 LAB — WET PREP BY MOLECULAR PROBE
Candida species: NOT DETECTED
MICRO NUMBER:: 13207567
SPECIMEN QUALITY:: ADEQUATE
Trichomonas vaginosis: DETECTED — AB

## 2022-03-27 LAB — HEPATITIS C ANTIBODY
Hepatitis C Ab: NONREACTIVE
SIGNAL TO CUT-OFF: <0.02 (ref <1.00)

## 2022-03-27 LAB — HSV 1/2 AB (IGM), IFA W/RFLX TITER
HSV 1 IgM Screen: NEGATIVE
HSV 2 IgM Screen: NEGATIVE

## 2022-03-27 LAB — HSV(HERPES SIMPLEX VRS) I + II AB-IGG
HAV 1 IGG,TYPE SPECIFIC AB: <0.9 index
HSV 2 IGG,TYPE SPECIFIC AB: <0.9 index

## 2022-03-27 LAB — C. TRACHOMATIS/N. GONORRHOEAE RNA
C. trachomatis RNA, TMA: NOT DETECTED
N. gonorrhoeae RNA, TMA: NOT DETECTED

## 2022-03-27 LAB — RPR: RPR Ser Ql: NONREACTIVE

## 2022-03-27 LAB — HIV ANTIBODY (ROUTINE TESTING W REFLEX): HIV 1&2 Ab, 4th Generation: NONREACTIVE

## 2022-04-04 ENCOUNTER — Encounter: Payer: Self-pay | Admitting: *Deleted

## 2022-04-25 ENCOUNTER — Encounter: Payer: Self-pay | Admitting: Primary Care

## 2022-04-25 ENCOUNTER — Ambulatory Visit (INDEPENDENT_AMBULATORY_CARE_PROVIDER_SITE_OTHER): Payer: Self-pay | Admitting: Primary Care

## 2022-04-25 VITALS — BP 110/60 | HR 91 | Ht 62.0 in | Wt 118.0 lb

## 2022-04-25 DIAGNOSIS — Z3009 Encounter for other general counseling and advice on contraception: Secondary | ICD-10-CM

## 2022-04-25 DIAGNOSIS — R519 Headache, unspecified: Secondary | ICD-10-CM

## 2022-04-25 DIAGNOSIS — R8290 Unspecified abnormal findings in urine: Secondary | ICD-10-CM

## 2022-04-25 DIAGNOSIS — F419 Anxiety disorder, unspecified: Secondary | ICD-10-CM

## 2022-04-25 DIAGNOSIS — F32A Depression, unspecified: Secondary | ICD-10-CM

## 2022-04-25 LAB — POCT URINALYSIS DIP (CLINITEK)
Bilirubin, UA: NEGATIVE
Blood, UA: NEGATIVE
Glucose, UA: NEGATIVE mg/dL
Ketones, POC UA: NEGATIVE mg/dL
Nitrite, UA: POSITIVE — AB
Spec Grav, UA: 1.02 (ref 1.010–1.025)
Urobilinogen, UA: 0.2 E.U./dL
pH, UA: 6 (ref 5.0–8.0)

## 2022-04-25 NOTE — Assessment & Plan Note (Signed)
Repeat STD testing pending given prior trichomonas diagnosis. ? ?Labs for trichomonas, gonorrhea, chlamydia, wet prep pending ? ?Urinalysis and urine culture collected and pending. ? ?Await results. ?

## 2022-04-25 NOTE — Assessment & Plan Note (Signed)
Strongly advised she start taking propanolol ER 80 milligrams daily. ?

## 2022-04-25 NOTE — Assessment & Plan Note (Signed)
Strongly advised she start taking Lexapro 5 mg daily.  She agrees. ?

## 2022-04-25 NOTE — Progress Notes (Signed)
? ?Subjective:  ? ? Patient ID: Tanya Burnett, female    DOB: [birth year], 21 y.o.   MRN: [id number] ? ?HPI ? ?Tanya Burnett is a very pleasant 21 y.o. female with a history of trichomonas, bacterial vaginosis cystitis with E. coli culture positive who presents today to discuss foul-smelling urine and for follow up of chronic conditions. ? ?1) Foul Smelling Urine: She was last evaluated on 03/22/2022 for follow-up and for STD testing.  She had noticed a foul smell to her urine over the last week, and her partner had noticed some penile itching.  Her labs resulted positive for trichomonas and E. coli cystitis.  She was treated with metronidazole 500 mg tablets twice daily x7 days.  We also recommended to repeat her urinalysis and culture when she completed her metronidazole. ? ?She presents today for repeat urine test and follow-up. Today she endorses picking up her metronidazole from the pharmacy and completed her regimen one month ago. Initially she noticed resolve of her foul smelling urine. Three weeks ago her foul urine smell returned. ? ?She denies vaginal itching, vaginal discharge, dysuria. She is sexually active with a different partner. Her current partner has no symptoms.  ? ?2) Anxiety and Depression: Currently prescribed Lexapro 5 mg.  Not currently taking Lexapro, does not have a good reason as to why.  Lexapro has been beneficial for symptoms previously. ? ?3) Frequent Headaches: Currently prescribed propranolol ER 80 mg daily. She picked up propanolol from the pharmacy but has yet to start taking. She continues to struggle with daily headaches. ? ?Review of Systems  ?Genitourinary:  Negative for dysuria, frequency, hematuria and vaginal discharge.  ?     Foul smelling urine  ?Neurological:  Positive for headaches.  ?Psychiatric/Behavioral:  The patient is nervous/anxious.   ? ?   ? ? ?Past Medical History:  ?Diagnosis Date  ? Foul smelling urine 10/05/2021  ? Frequent headaches   ? Syncope    ? ? ?Social History  ? ?Socioeconomic History  ? Marital status: Single  ?  Spouse name: Not on file  ? Number of children: Not on file  ? Years of education: Not on file  ? Highest education level: Not on file  ?Occupational History  ? Not on file  ?Tobacco Use  ? Smoking status: Never  ? Smokeless tobacco: Never  ?Vaping Use  ? Vaping Use: Never used  ?Substance and Sexual Activity  ? Alcohol use: Never  ? Drug use: Never  ? Sexual activity: Not on file  ?Other Topics Concern  ? Not on file  ?Social History Narrative  ? Not on file  ? ?Social Determinants of Health  ? ?Financial Resource Strain: Not on file  ?Food Insecurity: Not on file  ?Transportation Needs: Not on file  ?Physical Activity: Not on file  ?Stress: Not on file  ?Social Connections: Not on file  ?Intimate Partner Violence: Not on file  ? ? ?History reviewed. No pertinent surgical history. ? ?Family History  ?Problem Relation Age of Onset  ? Depression Mother   ? Depression Father   ? Alcohol abuse Father   ? ? ?Allergies  ?Allergen Reactions  ? Almond (Diagnostic)   ? Black [place]   ? Hazelnut [facility]) Allergy Skin Test   ? Peanut (Diagnostic)   ? Sesame Seed (Diagnostic)   ? ? ?Current Outpatient Medications on File Prior to Visit  ?Medication Sig Dispense Refill  ? azelastine (ASTELIN) 0.1 % nasal spray Place 2 sprays into  both nostrils 2 (two) times daily as needed for rhinitis. Use in each nostril as directed (Patient not taking: Reported on 10/05/2021) 30 mL 12  ? cetirizine (ZYRTEC) 10 MG tablet Take 1 tablet (10 mg total) by mouth daily. For allergies. 90 tablet 1  ? EPINEPHrine 0.3 mg/0.3 mL IJ SOAJ injection Inject 0.3 mg into the muscle as needed for anaphylaxis. (Patient not taking: Reported on 10/05/2021) 1 each 0  ? escitalopram (LEXAPRO) 5 MG tablet Take 1 tablet (5 mg total) by mouth daily. For anxiety and depression (Patient not taking: Reported on 03/22/2022) 90 tablet 3  ? propranolol ER (INDERAL LA) 80 MG 24 hr capsule  Take 1 capsule (80 mg total) by mouth daily. For headache prevention 90 capsule 0  ? ?No current facility-administered medications on file prior to visit.  ? ? ?BP 110/60   Pulse 91   Ht 5\' 2"  (1.575 m)   Wt 118 lb (53.5 kg)   SpO2 99%   BMI 21.58 kg/m?  ?Objective:  ? Physical Exam ?Pulmonary:  ?   Effort: Pulmonary effort is normal.  ?Genitourinary: ?   Labia:     ?   Right: No lesion.     ?   Left: No lesion.   ?   Cervix: Discharge present. No erythema.  ?   Comments: Scant amount of light green discharge from cervix. No foul smell. ?Neurological:  ?   Mental Status: She is alert.  ? ? ? ? ? ?   ?Assessment & Plan:  ? ? ? ? ?This visit occurred during the SARS-CoV-2 public health emergency.  Safety protocols were in place, including screening questions prior to the visit, additional usage of staff PPE, and extensive cleaning of exam room while observing appropriate contact time as indicated for disinfecting solutions.  ?

## 2022-04-25 NOTE — Patient Instructions (Signed)
We will be in touch with your results in 1 to 2 days. ? ?Start taking Lexapro daily for anxiety depression. ?Start taking propanolol ER 80 mg once daily for headaches. ? ?It was a pleasure to see you today! ? ? ?

## 2022-04-25 NOTE — Assessment & Plan Note (Signed)
Nexplanon in place as of recent. ?

## 2022-04-26 LAB — WET PREP BY MOLECULAR PROBE
Candida species: NOT DETECTED
Gardnerella vaginalis: NOT DETECTED
MICRO NUMBER:: 13352164
SPECIMEN QUALITY:: ADEQUATE
Trichomonas vaginosis: NOT DETECTED

## 2022-04-26 LAB — C. TRACHOMATIS/N. GONORRHOEAE RNA
C. trachomatis RNA, TMA: NOT DETECTED
N. gonorrhoeae RNA, TMA: NOT DETECTED

## 2022-04-27 LAB — URINE CULTURE
MICRO NUMBER:: 13352125
SPECIMEN QUALITY:: ADEQUATE

## 2022-04-28 ENCOUNTER — Other Ambulatory Visit: Payer: Self-pay | Admitting: Primary Care

## 2022-04-28 DIAGNOSIS — N3000 Acute cystitis without hematuria: Secondary | ICD-10-CM

## 2022-04-28 MED ORDER — NITROFURANTOIN MONOHYD MACRO 100 MG PO CAPS
100.0000 mg | ORAL_CAPSULE | Freq: Two times a day (BID) | ORAL | 0 refills | Status: AC
Start: 2022-04-28 — End: 2022-05-03

## 2022-11-06 ENCOUNTER — Ambulatory Visit: Payer: No Typology Code available for payment source | Admitting: Primary Care

## 2022-11-06 ENCOUNTER — Encounter: Payer: Self-pay | Admitting: *Deleted

## 2022-11-06 ENCOUNTER — Encounter: Payer: Self-pay | Admitting: Primary Care

## 2022-11-06 VITALS — BP 122/80 | HR 62 | Temp 97.2°F | Ht 62.0 in | Wt 121.0 lb

## 2022-11-06 DIAGNOSIS — R8290 Unspecified abnormal findings in urine: Secondary | ICD-10-CM

## 2022-11-06 DIAGNOSIS — R519 Headache, unspecified: Secondary | ICD-10-CM | POA: Diagnosis not present

## 2022-11-06 DIAGNOSIS — R0789 Other chest pain: Secondary | ICD-10-CM | POA: Insufficient documentation

## 2022-11-06 DIAGNOSIS — F32A Depression, unspecified: Secondary | ICD-10-CM

## 2022-11-06 DIAGNOSIS — F419 Anxiety disorder, unspecified: Secondary | ICD-10-CM

## 2022-11-06 DIAGNOSIS — Z87440 Personal history of urinary (tract) infections: Secondary | ICD-10-CM | POA: Diagnosis not present

## 2022-11-06 LAB — POC URINALSYSI DIPSTICK (AUTOMATED)
Bilirubin, UA: NEGATIVE
Blood, UA: NEGATIVE
Glucose, UA: NEGATIVE
Ketones, UA: NEGATIVE
Leukocytes, UA: NEGATIVE
Nitrite, UA: POSITIVE
Protein, UA: NEGATIVE
Spec Grav, UA: 1.02 (ref 1.010–1.025)
Urobilinogen, UA: 0.2 E.U./dL
pH, UA: 6 (ref 5.0–8.0)

## 2022-11-06 MED ORDER — FAMOTIDINE 20 MG PO TABS: 20.0000 mg | ORAL_TABLET | Freq: Two times a day (BID) | ORAL | 0 refills | Status: DC

## 2022-11-06 MED ORDER — PROPRANOLOL HCL ER 80 MG PO CP24: 80.0000 mg | ORAL_CAPSULE | Freq: Every day | ORAL | 3 refills | Status: DC

## 2022-11-06 MED ORDER — ESCITALOPRAM OXALATE 5 MG PO TABS: 5.0000 mg | ORAL_TABLET | Freq: Every day | ORAL | 3 refills | Status: DC

## 2022-11-06 NOTE — Assessment & Plan Note (Signed)
UA today with positive nitrites, otherwise negative. Urine culture pending.  She has not been sexually active in 2 months.

## 2022-11-06 NOTE — Patient Instructions (Addendum)
Resume Lexapro 5 mg daily for anxiety and depression.  You will be contacted regarding your referral to therapy and for the MRI of your brain.  Please let us know if you have not been contacted within two weeks.   Resume propanolol ER 80 mg once daily for headache prevention.  Start famotidine 20 mg once or twice daily for chest tightness/heartburn.  Please update me in a few weeks on MyChart.  It was a pleasure to see you today!

## 2022-11-06 NOTE — Progress Notes (Signed)
Subjective:    Patient ID: Tanya Burnett, female    DOB: 11/09/2001, 21 y.o.   MRN: [id number]  HPI  Tanya Burnett is a very pleasant 21 y.o. female with a history of hives, chronic idiopathic urticaria, syncope, proteinuria who presents today for hospital follow-up, to discuss chest tightness, and to discuss anxiety/depression.   1) Sepsis: Admitted to [facility] on 09/05/2022 for UTI with sepsis.  She was transferred originally from [facility].  Symptoms included tachycardia, fevers, tachypnea, lactic acidosis, leukocytosis.  During her hospital stay she was treated with antibiotics for UTI and empirically for gonorrhea/chlamydia.  She was discharged home on 09/06/2022 with prescriptions for cefdinir 300 mg twice daily x7 days, Flagyl 5 mg twice daily x7 days, doxycycline 100 mg twice daily x7 days.  Since her hospital stay She completed all three antibiotics from her hospital stay in September 2023. She continues to notice foul smelling urine, right lower back pain. She denies dysuria, increased vaginal discharge, vaginal itching, pelvic pain. She has not been sexually active for 2 months.  2) Chest Tightness: Evaluated at urgent care through [facility] on 10/28/2022 for chest tightness.  Symptoms and exam were consistent with costochondritis. She was told to take Tylenol or Ibuprofen. Today she continues to notice mid upper chest tightness intermittently that occurs with eating, also with esophageal burning, throat fullness. Her mother gave her Tums once with some improvement. She denies abdominal pain, diarrhea, constipation.     3) Anxiety and Depression: Previously managed on Lexapro 5 mg daily. She has not taken this medication in months.  She's felt more anxiety and depression over the last several months given the unexpected passing of her boyfriend.  Symptoms include constant worrying, feeling down/depressed, paranoia.  4) Frequent Headaches: Chronic for years.  Typically located to the frontal and temporal lobes for which she describes as a bandlike pressure.  Previously managed on propanolol ER 80 mg daily for headache prevention which was helpful.  Unfortunately, she lost her medication during her move back to [place] from [place].  She has never undergone MRI of the brain.  Review of Systems  Constitutional:  Negative for fever.  Respiratory:  Negative for shortness of breath.   Cardiovascular:  Positive for chest pain.  Gastrointestinal:  Positive for nausea. Negative for abdominal pain, constipation and diarrhea.  Genitourinary:  Negative for dysuria, frequency, urgency and vaginal discharge.       Foul-smelling urine  Neurological:  Positive for headaches.         Past Medical History:  Diagnosis Date   Foul smelling urine 10/05/2021   Frequent headaches    Syncope     Social History   Socioeconomic History   Marital status: Single    Spouse name: Not on file   Number of children: Not on file   Years of education: Not on file   Highest education level: Not on file  Occupational History   Not on file  Tobacco Use   Smoking status: Never   Smokeless tobacco: Never  Vaping Use   Vaping Use: Never used  Substance and Sexual Activity   Alcohol use: Never   Drug use: Never   Sexual activity: Not on file  Other Topics Concern   Not on file  Social History Narrative   Not on file   Social Determinants of Health   Financial Resource Strain: Not on file  Food Insecurity: Not on file  Transportation Needs: Not on file  Physical  Activity: Not on file  Stress: Not on file  Social Connections: Not on file  Intimate Partner Violence: Not on file    No past surgical history on file.  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 Use in each nostril as directed (Patient not taking: Reported on 10/05/2021) 30 mL 12   cetirizine (ZYRTEC) 10 MG tablet Take 1 tablet (10 mg total) by mouth daily. For allergies. (Patient not taking: Reported on 11/06/2022) 90 tablet 1   EPINEPHrine 0.3 mg/0.3 mL IJ SOAJ injection Inject 0.3 mg into the muscle as needed for anaphylaxis. (Patient not taking: Reported on 11/06/2022) 1 each 0   No current facility-administered medications on file prior to visit.    BP 122/80   Pulse 62   Temp (!) 97.2 F (36.2 C) (Temporal)   Ht 5\' 2"  (1.575 m)   Wt 121 lb (54.9 kg)   SpO2 99%   BMI 22.13 kg/m  Objective:   Physical Exam Cardiovascular:     Rate and Rhythm: Normal rate and regular rhythm.  Pulmonary:     Effort: Pulmonary effort is normal.     Breath sounds: Normal breath sounds.  Abdominal:     Palpations: Abdomen is soft.     Tenderness: There is no abdominal tenderness.  Musculoskeletal:     Cervical back: Neck supple.  Skin:    General: Skin is warm and dry.  Psychiatric:        Mood and Affect: Mood normal.           Assessment & Plan:   Problem List Items Addressed This Visit       Genitourinary   History of UTI    With sepsis and hospitalization.  Reviewed hospital notes from [facility] through care everywhere.  Repeat UA today given ongoing symptoms. UA today with nitrites positive, otherwise negative. Urine culture ordered and pending.        Other   Frequent headaches - Primary    Resume propanolol ER 80 mg daily for headache prevention.  MRI brain ordered and pending as we have no imaging on file.      Relevant Medications   escitalopram (LEXAPRO) 5 MG tablet    propranolol ER (INDERAL LA) 80 MG 24 hr capsule   Other Relevant Orders   MR Brain Wo Contrast   Foul smelling urine    UA today with positive nitrites, otherwise negative. Urine culture pending.  She has not been sexually active in 2 months.      Relevant Orders   POCT Urinalysis Dipstick (Automated) (Completed)   Urine Culture   Anxiety and depression    Deteriorated.  Resume Lexapro 5 mg daily. Referral placed for therapy.  She will update if no improvement.       Relevant Medications   escitalopram (LEXAPRO) 5 MG tablet   Other Relevant Orders   Ambulatory referral to Psychology   Chest tightness    Symptoms  are representative of GERD, discussed this with patient today.  Start famotidine 20 mg 1-2 times daily. She will update in a few weeks if no improvement.  We also discussed triggers for GERD.      Relevant Medications   famotidine (PEPCID) 20 MG tablet       Doreene Nest, NP

## 2022-11-06 NOTE — Assessment & Plan Note (Signed)
Deteriorated.  Resume Lexapro 5 mg daily. Referral placed for therapy.  She will update if no improvement.

## 2022-11-06 NOTE — Assessment & Plan Note (Signed)
Resume propanolol ER 80 mg daily for headache prevention.  MRI brain ordered and pending as we have no imaging on file.

## 2022-11-06 NOTE — Assessment & Plan Note (Addendum)
With sepsis and hospitalization.  Reviewed hospital notes from [facility] through care everywhere.  Repeat UA today given ongoing symptoms. UA today with nitrites positive, otherwise negative. Urine culture ordered and pending.

## 2022-11-06 NOTE — Assessment & Plan Note (Addendum)
Symptoms are representative of GERD, discussed this with patient today.  Start famotidine 20 mg 1-2 times daily. She will update in a few weeks if no improvement.  We also discussed triggers for GERD.

## 2022-11-09 LAB — URINE CULTURE
MICRO NUMBER:: 14193092
SPECIMEN QUALITY:: ADEQUATE

## 2022-11-10 ENCOUNTER — Other Ambulatory Visit: Payer: Self-pay | Admitting: Primary Care

## 2022-11-10 DIAGNOSIS — N3000 Acute cystitis without hematuria: Secondary | ICD-10-CM

## 2022-11-10 MED ORDER — SULFAMETHOXAZOLE-TRIMETHOPRIM 800-160 MG PO TABS: 1.0000 | ORAL_TABLET | Freq: Two times a day (BID) | ORAL | 0 refills | Status: DC

## 2022-11-24 ENCOUNTER — Encounter (HOSPITAL_COMMUNITY): Payer: Self-pay

## 2022-11-24 ENCOUNTER — Emergency Department (HOSPITAL_COMMUNITY): Payer: No Typology Code available for payment source

## 2022-11-24 ENCOUNTER — Other Ambulatory Visit: Payer: Self-pay

## 2022-11-24 ENCOUNTER — Observation Stay (HOSPITAL_COMMUNITY): Payer: No Typology Code available for payment source

## 2022-11-24 ENCOUNTER — Observation Stay (HOSPITAL_COMMUNITY)
Admission: EM | Admit: 2022-11-24 | Discharge: 2022-11-25 | Disposition: A | Discharge status: Home / Self Care | Payer: No Typology Code available for payment source | Attending: General Surgery | Admitting: General Surgery

## 2022-11-24 DIAGNOSIS — Z79899 Other long term (current) drug therapy: Secondary | ICD-10-CM | POA: Insufficient documentation

## 2022-11-24 DIAGNOSIS — Y92511 Restaurant or cafe as the place of occurrence of the external cause: Secondary | ICD-10-CM | POA: Insufficient documentation

## 2022-11-24 DIAGNOSIS — Z23 Encounter for immunization: Secondary | ICD-10-CM | POA: Insufficient documentation

## 2022-11-24 DIAGNOSIS — S31119A Laceration without foreign body of abdominal wall, unspecified quadrant without penetration into peritoneal cavity, initial encounter: Secondary | ICD-10-CM | POA: Diagnosis not present

## 2022-11-24 LAB — CBC
HCT: 41.1 % (ref 36.0–46.0)
Hemoglobin: 14 g/dL (ref 12.0–15.0)
MCH: 30.5 pg (ref 26.0–34.0)
MCHC: 34.1 g/dL (ref 30.0–36.0)
MCV: 89.5 fL (ref 80.0–100.0)
Platelets: 315 10*3/uL (ref 150–400)
RBC: 4.59 MIL/uL (ref 3.87–5.11)
RDW: 13.2 % (ref 11.5–15.5)
WBC: 4.4 10*3/uL (ref 4.0–10.5)
nRBC: 0 % (ref 0.0–0.2)

## 2022-11-24 LAB — I-STAT VENOUS BLOOD GAS, ED
Acid-base deficit: 6 mmol/L — ABNORMAL HIGH (ref 0.0–2.0)
Bicarbonate: 18.6 mmol/L — ABNORMAL LOW (ref 20.0–28.0)
Calcium, Ion: 1.12 mmol/L — ABNORMAL LOW (ref 1.15–1.40)
HCT: 43 % (ref 36.0–46.0)
Hemoglobin: 14.6 g/dL (ref 12.0–15.0)
O2 Saturation: 85 %
Potassium: 3.4 mmol/L — ABNORMAL LOW (ref 3.5–5.1)
Sodium: 140 mmol/L (ref 135–145)
TCO2: 20 mmol/L — ABNORMAL LOW (ref 22–32)
pCO2, Ven: 31.9 mmHg — ABNORMAL LOW (ref 44–60)
pH, Ven: 7.374 (ref 7.25–7.43)
pO2, Ven: 51 mmHg — ABNORMAL HIGH (ref 32–45)

## 2022-11-24 LAB — ETHANOL: Alcohol, Ethyl (B): <10 mg/dL (ref <10)

## 2022-11-24 LAB — PROTIME-INR
INR: 1.1 (ref 0.8–1.2)
Prothrombin Time: 13.9 seconds (ref 11.4–15.2)

## 2022-11-24 LAB — COMPREHENSIVE METABOLIC PANEL
ALT: 14 U/L (ref 0–44)
AST: 33 U/L (ref 15–41)
Albumin: 4.4 g/dL (ref 3.5–5.0)
Alkaline Phosphatase: 45 U/L (ref 38–126)
Anion gap: 18 — ABNORMAL HIGH (ref 5–15)
BUN: 9 mg/dL (ref 6–20)
CO2: 17 mmol/L — ABNORMAL LOW (ref 22–32)
Calcium: 9.4 mg/dL (ref 8.9–10.3)
Chloride: 104 mmol/L (ref 98–111)
Creatinine, Ser: 0.88 mg/dL (ref 0.44–1.00)
GFR, Estimated: >60 mL/min (ref >60)
Glucose, Bld: 119 mg/dL — ABNORMAL HIGH (ref 70–99)
Potassium: 3.3 mmol/L — ABNORMAL LOW (ref 3.5–5.1)
Sodium: 139 mmol/L (ref 135–145)
Total Bilirubin: 0.2 mg/dL — ABNORMAL LOW (ref 0.3–1.2)
Total Protein: 7.6 g/dL (ref 6.5–8.1)

## 2022-11-24 LAB — I-STAT CHEM 8, ED
BUN: 10 mg/dL (ref 6–20)
Calcium, Ion: 1.12 mmol/L — ABNORMAL LOW (ref 1.15–1.40)
Chloride: 105 mmol/L (ref 98–111)
Creatinine, Ser: 0.7 mg/dL (ref 0.44–1.00)
Glucose, Bld: 119 mg/dL — ABNORMAL HIGH (ref 70–99)
HCT: 44 % (ref 36.0–46.0)
Hemoglobin: 15 g/dL (ref 12.0–15.0)
Potassium: 3.3 mmol/L — ABNORMAL LOW (ref 3.5–5.1)
Sodium: 140 mmol/L (ref 135–145)
TCO2: 20 mmol/L — ABNORMAL LOW (ref 22–32)

## 2022-11-24 LAB — SAMPLE TO BLOOD BANK

## 2022-11-24 LAB — I-STAT BETA HCG BLOOD, ED (MC, WL, AP ONLY): I-stat hCG, quantitative: <5 m[IU]/mL (ref <5)

## 2022-11-24 LAB — HIV ANTIBODY (ROUTINE TESTING W REFLEX): HIV Screen 4th Generation wRfx: NONREACTIVE

## 2022-11-24 LAB — LACTIC ACID, PLASMA: Lactic Acid, Venous: 7.3 mmol/L (ref 0.5–1.9)

## 2022-11-24 MED ORDER — FENTANYL CITRATE PF 50 MCG/ML IJ SOSY
100.0000 ug | PREFILLED_SYRINGE | Freq: Once | INTRAMUSCULAR | Status: AC
  Administered 2022-11-24: 100 ug via INTRAVENOUS
  Filled 2022-11-24: qty 2

## 2022-11-24 MED ORDER — FENTANYL CITRATE PF 50 MCG/ML IJ SOSY
100.0000 ug | PREFILLED_SYRINGE | Freq: Once | INTRAMUSCULAR | Status: DC
  Filled 2022-11-24: qty 2

## 2022-11-24 MED ORDER — MORPHINE SULFATE (PF) 2 MG/ML IV SOLN
2.0000 mg | INTRAVENOUS | Status: DC | PRN
  Administered 2022-11-24 – 2022-11-25 (×2): 2 mg via INTRAVENOUS
  Filled 2022-11-24: qty 2
  Filled 2022-11-24 (×2): qty 1

## 2022-11-24 MED ORDER — ONDANSETRON 4 MG PO TBDP: 4.0000 mg | ORAL_TABLET | Freq: Four times a day (QID) | ORAL | Status: DC | PRN

## 2022-11-24 MED ORDER — OXYCODONE HCL 5 MG PO TABS
5.0000 mg | ORAL_TABLET | ORAL | Status: DC | PRN
  Administered 2022-11-24 – 2022-11-25 (×4): 5 mg via ORAL
  Filled 2022-11-24 (×4): qty 1

## 2022-11-24 MED ORDER — LIDOCAINE-EPINEPHRINE (PF) 2 %-1:200000 IJ SOLN
20.0000 mL | Freq: Once | INTRAMUSCULAR | Status: AC
  Administered 2022-11-24: 20 mL
  Filled 2022-11-24: qty 20

## 2022-11-24 MED ORDER — CEFAZOLIN SODIUM-DEXTROSE 2-4 GM/100ML-% IV SOLN
2.0000 g | Freq: Once | INTRAVENOUS | Status: AC
  Administered 2022-11-24: 2 g via INTRAVENOUS
  Filled 2022-11-24: qty 100

## 2022-11-24 MED ORDER — FENTANYL CITRATE PF 50 MCG/ML IJ SOSY
PREFILLED_SYRINGE | INTRAMUSCULAR | Status: AC
  Filled 2022-11-24: qty 1

## 2022-11-24 MED ORDER — IOHEXOL 350 MG/ML SOLN
75.0000 mL | Freq: Once | INTRAVENOUS | Status: AC | PRN
  Administered 2022-11-24: 75 mL via INTRAVENOUS

## 2022-11-24 MED ORDER — FENTANYL CITRATE (PF) 100 MCG/2ML IJ SOLN
INTRAMUSCULAR | Status: AC
  Filled 2022-11-24: qty 2

## 2022-11-24 MED ORDER — METOPROLOL TARTRATE 5 MG/5ML IV SOLN: 5.0000 mg | Freq: Four times a day (QID) | INTRAVENOUS | Status: DC | PRN

## 2022-11-24 MED ORDER — ACETAMINOPHEN 325 MG PO TABS
650.0000 mg | ORAL_TABLET | ORAL | Status: DC | PRN
  Administered 2022-11-25 (×2): 650 mg via ORAL
  Filled 2022-11-24 (×2): qty 2

## 2022-11-24 MED ORDER — SODIUM CHLORIDE 0.9 % IV BOLUS
1000.0000 mL | Freq: Once | INTRAVENOUS | Status: AC
  Administered 2022-11-24: 1000 mL via INTRAVENOUS

## 2022-11-24 MED ORDER — KETAMINE HCL 50 MG/5ML IJ SOSY: 0.3000 mg/kg | PREFILLED_SYRINGE | Freq: Once | INTRAMUSCULAR | Status: DC

## 2022-11-24 MED ORDER — ONDANSETRON HCL 4 MG/2ML IJ SOLN: 4.0000 mg | Freq: Four times a day (QID) | INTRAMUSCULAR | Status: DC | PRN

## 2022-11-24 MED ORDER — SODIUM CHLORIDE 0.9 % IV SOLN: INTRAVENOUS | Status: DC

## 2022-11-24 MED ORDER — KETAMINE HCL 50 MG/5ML IJ SOSY
30.0000 mg | PREFILLED_SYRINGE | Freq: Once | INTRAMUSCULAR | Status: AC
  Administered 2022-11-24: 30 mg via INTRAVENOUS
  Filled 2022-11-24: qty 5

## 2022-11-24 MED ORDER — FENTANYL CITRATE PF 50 MCG/ML IJ SOSY
50.0000 ug | PREFILLED_SYRINGE | Freq: Once | INTRAMUSCULAR | Status: AC
  Administered 2022-11-24: 50 ug via INTRAVENOUS

## 2022-11-24 MED ORDER — KETAMINE HCL 50 MG/5ML IJ SOSY
20.0000 mg | PREFILLED_SYRINGE | Freq: Once | INTRAMUSCULAR | Status: AC
  Administered 2022-11-24: 20 mg via INTRAVENOUS

## 2022-11-24 MED ORDER — MELATONIN 3 MG PO TABS: 3.0000 mg | ORAL_TABLET | Freq: Every evening | ORAL | Status: DC | PRN

## 2022-11-24 MED ORDER — LIDOCAINE HCL (PF) 1 % IJ SOLN
INTRAMUSCULAR | Status: AC
  Filled 2022-11-24: qty 30

## 2022-11-24 MED ORDER — TETANUS-DIPHTH-ACELL PERTUSSIS 5-2.5-18.5 LF-MCG/0.5 IM SUSY
0.5000 mL | PREFILLED_SYRINGE | Freq: Once | INTRAMUSCULAR | Status: AC
  Administered 2022-11-24: 0.5 mL via INTRAMUSCULAR

## 2022-11-24 MED ORDER — ENOXAPARIN SODIUM 30 MG/0.3ML IJ SOSY
30.0000 mg | PREFILLED_SYRINGE | Freq: Two times a day (BID) | INTRAMUSCULAR | Status: DC
  Administered 2022-11-25: 30 mg via SUBCUTANEOUS
  Filled 2022-11-24: qty 0.3
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
## 2022-11-24 NOTE — ED Notes (Signed)
Dr Sheliah Hatch at bedside to suture finger laceration.
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
## 2022-11-24 NOTE — Plan of Care (Signed)

## 2022-11-25 ENCOUNTER — Other Ambulatory Visit: Payer: Medicaid Other

## 2022-11-25 ENCOUNTER — Other Ambulatory Visit (HOSPITAL_COMMUNITY): Payer: Self-pay

## 2022-11-25 LAB — CBC
HCT: 35 % — ABNORMAL LOW (ref 36.0–46.0)
Hemoglobin: 12.1 g/dL (ref 12.0–15.0)
MCH: 30.9 pg (ref 26.0–34.0)
MCHC: 34.6 g/dL (ref 30.0–36.0)
MCV: 89.5 fL (ref 80.0–100.0)
Platelets: 238 10*3/uL (ref 150–400)
RBC: 3.91 MIL/uL (ref 3.87–5.11)
RDW: 13.3 % (ref 11.5–15.5)
WBC: 5.7 10*3/uL (ref 4.0–10.5)
nRBC: 0 % (ref 0.0–0.2)

## 2022-11-25 MED ORDER — ACETAMINOPHEN 500 MG PO TABS: 1000.0000 mg | ORAL_TABLET | Freq: Four times a day (QID) | ORAL | Status: DC

## 2022-11-25 MED ORDER — IBUPROFEN 200 MG PO TABS
400.0000 mg | ORAL_TABLET | Freq: Three times a day (TID) | ORAL | 0 refills | Status: DC | PRN
  Filled 2022-11-25: qty 30, 4d supply, fill #0

## 2022-11-25 MED ORDER — OXYCODONE HCL 5 MG PO TABS
5.0000 mg | ORAL_TABLET | ORAL | 0 refills | Status: DC | PRN
  Filled 2022-11-25: qty 15, 3d supply, fill #0

## 2022-11-25 NOTE — Progress Notes (Signed)
Pt ready for DC to aunts house in [place]. DC instructions given and explained to pt and mom. Wound care instruction given and supplies given to pt. Follow up for 12/06/22 appt given for CCS for staple and suture removal to be done by nurse. No questions verbalized about further care. Pt requesting a letter from MD since she works at [employer]. Dr. Janee Morn in OR but will do once he is out.

## 2022-11-25 NOTE — Progress Notes (Signed)
Mobility Specialist - Progress Note   11/25/22 1155  Mobility  Activity Ambulated with assistance in hallway  Level of Assistance Minimal assist, patient does 75% or more  Assistive Device None  Distance Ambulated (ft) 60 ft  Activity Response Tolerated well  Mobility Referral Yes  $Mobility charge 1 Mobility    Pt received in bed agreeable to mobility. Distance limited by c/o left flank pain, rated 9/10. MinA needed to move in and out of bed. Left in bed w/ call bell in reach and all needs met.   [place] Specialist Please contact via SecureChat or Rehab office at [phone]

## 2022-11-25 NOTE — Discharge Instructions (Addendum)
You  may shower.  Do not bathe until these are all completely healed.  You have no activity restrictions.  Let pain be your guide.  Trauma Resources [facility]:  Address: [address], [place], [place] [address] Hours:  Closes 5?PM Phone: [phone]  [facility] [place]:  Located in: [facility] Address: [address], [place], [place] [address] Areas served:  [employer]:  Closes 8?PM  Phone: [phone]  Trauma Focused Therapist(s):   Dannielle Karvonen [address] [place], [place] [address]  [email or web address]  Jerelyn Charles [facility] [place], [place] [address]  [phone] Licensed Clinical Mental Health Counselor, [place], [facility], [facility], [facility] [phone] and [phone] QRFXJO ITGPQDIYME [place], [place] [phone] [facility] [address], Studio [address] [place], [place]  [phone] Trauma Support Groups [facility] [address] [place], [place]   [facility] (Emotional Abuse Survivor) [phone] The [address] [place], [place]  [phone]
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
## 2022-11-26 ENCOUNTER — Encounter: Payer: Self-pay | Admitting: Primary Care

## 2022-12-05 ENCOUNTER — Ambulatory Visit
Admission: RE | Admit: 2022-12-05 | Discharge: 2022-12-05 | Disposition: A | Discharge status: Home / Self Care | Payer: Medicaid Other | Source: Ambulatory Visit | Attending: Primary Care | Admitting: Primary Care

## 2022-12-05 DIAGNOSIS — R519 Headache, unspecified: Secondary | ICD-10-CM

## 2022-12-11 ENCOUNTER — Ambulatory Visit: Payer: Medicaid Other | Admitting: Primary Care

## 2022-12-24 ENCOUNTER — Encounter: Payer: Self-pay | Admitting: Primary Care

## 2022-12-24 ENCOUNTER — Ambulatory Visit: Payer: Medicaid Other | Admitting: Primary Care

## 2022-12-24 ENCOUNTER — Ambulatory Visit: Payer: No Typology Code available for payment source | Admitting: Primary Care

## 2022-12-24 VITALS — BP 116/82 | HR 93 | Temp 97.5°F | Ht 62.0 in | Wt 122.0 lb

## 2022-12-24 DIAGNOSIS — F419 Anxiety disorder, unspecified: Secondary | ICD-10-CM

## 2022-12-24 DIAGNOSIS — R519 Headache, unspecified: Secondary | ICD-10-CM

## 2022-12-24 DIAGNOSIS — S31119S Laceration without foreign body of abdominal wall, unspecified quadrant without penetration into peritoneal cavity, sequela: Secondary | ICD-10-CM

## 2022-12-24 DIAGNOSIS — S61219S Laceration without foreign body of unspecified finger without damage to nail, sequela: Secondary | ICD-10-CM

## 2022-12-24 DIAGNOSIS — R350 Frequency of micturition: Secondary | ICD-10-CM

## 2022-12-24 DIAGNOSIS — Z87440 Personal history of urinary (tract) infections: Secondary | ICD-10-CM

## 2022-12-24 DIAGNOSIS — F32A Depression, unspecified: Secondary | ICD-10-CM

## 2022-12-24 DIAGNOSIS — S61219A Laceration without foreign body of unspecified finger without damage to nail, initial encounter: Secondary | ICD-10-CM | POA: Insufficient documentation

## 2022-12-24 LAB — POC URINALSYSI DIPSTICK (AUTOMATED)
Bilirubin, UA: NEGATIVE
Blood, UA: NEGATIVE
Glucose, UA: NEGATIVE
Ketones, UA: NEGATIVE
Leukocytes, UA: NEGATIVE
Nitrite, UA: NEGATIVE
Protein, UA: NEGATIVE
Spec Grav, UA: 1.01 (ref 1.010–1.025)
Urobilinogen, UA: 0.2 E.U./dL
pH, UA: 6 (ref 5.0–8.0)

## 2022-12-24 MED ORDER — ESCITALOPRAM OXALATE 5 MG PO TABS: 5.0000 mg | ORAL_TABLET | Freq: Every day | ORAL | 3 refills | Status: DC

## 2022-12-24 NOTE — Assessment & Plan Note (Signed)
Continued.  She will pick up propranolol ER 80 mg to start daily. She will update if there is an issue with the pharmacy

## 2022-12-24 NOTE — Assessment & Plan Note (Signed)
Reviewed hospital notes, labs, imaging from December 2023. Wounds appear to have healed well. Scarring present.

## 2022-12-24 NOTE — Assessment & Plan Note (Signed)
Uncontrolled.  Re-ordered Lexapro 5 mg tablets. She will notify me if there is a continued problem with the pharmacy.  Discussed to contact [facility] for therapy referral that was placed in November 2023.
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
## 2022-12-24 NOTE — Patient Instructions (Addendum)
Call [facility] in [place] for your therapy appointment.  [phone]. Tell them that I placed a referral in November 2023.  Resume Lexapro 5 mg daily for anxiety/depression.  Resume propranolol ER 80 mg daily for headache prevention.   You will either be contacted via phone regarding your referral to occupational therapy for your finger, or you may receive a letter on your MyChart portal from our referral team with instructions for scheduling an appointment. Please let us know if you have not been contacted by anyone within two weeks.  Schedule a follow up visit for 1 month. This can be virtual.   It was a pleasure to see you today!

## 2022-12-24 NOTE — Assessment & Plan Note (Signed)
UA today negative. Urine culture pending given history of urosepsis.

## 2022-12-24 NOTE — Assessment & Plan Note (Signed)
Moderate decrease in ROM with pain.  Referral placed for occupational therapy.

## 2022-12-25 LAB — URINE CULTURE
MICRO NUMBER:: 14377182
Result:: NO GROWTH
SPECIMEN QUALITY:: ADEQUATE

## 2022-12-26 ENCOUNTER — Encounter: Payer: Self-pay | Admitting: Primary Care

## 2023-01-13 ENCOUNTER — Telehealth: Payer: Self-pay

## 2023-01-13 NOTE — Telephone Encounter (Signed)
Transition Care Management Unsuccessful Follow-up Telephone Call  Date of discharge and from where:  [facility] 1.19.24  Attempts:  1st Attempt  Reason for unsuccessful TCM follow-up call:  Left voice message

## 2023-01-14 NOTE — Telephone Encounter (Signed)
Transition Care Management Unsuccessful Follow-up Telephone Call  Date of discharge and from where:  [facility] 1.19.24  Attempts:  2nd Attempt  Reason for unsuccessful TCM follow-up call:  Left voice message

## 2023-01-15 NOTE — Telephone Encounter (Signed)
Transition Care Management Unsuccessful Follow-up Telephone Call  Date of discharge and from where:  [facility] 1.19.24  Attempts:  3rd Attempt  Reason for unsuccessful TCM follow-up call:  Left voice message
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
## 2023-01-20 ENCOUNTER — Ambulatory Visit: Payer: Medicaid Other | Attending: Primary Care | Admitting: Rehabilitative and Restorative Service Providers"

## 2023-01-24 ENCOUNTER — Encounter: Payer: Medicaid Other | Admitting: Primary Care

## 2023-01-25 NOTE — Progress Notes (Signed)
Not seen

## 2023-01-30 ENCOUNTER — Encounter: Payer: Self-pay | Admitting: Primary Care

## 2023-02-21 ENCOUNTER — Ambulatory Visit
Admission: EM | Admit: 2023-02-21 | Discharge: 2023-02-21 | Disposition: A | Discharge status: Home / Self Care | Payer: No Typology Code available for payment source | Attending: Family Medicine | Admitting: Family Medicine

## 2023-02-21 DIAGNOSIS — N3090 Cystitis, unspecified without hematuria: Secondary | ICD-10-CM

## 2023-02-21 DIAGNOSIS — Z202 Contact with and (suspected) exposure to infections with a predominantly sexual mode of transmission: Secondary | ICD-10-CM | POA: Diagnosis present

## 2023-02-21 LAB — POCT URINALYSIS DIP (MANUAL ENTRY)
Bilirubin, UA: NEGATIVE
Blood, UA: NEGATIVE
Glucose, UA: NEGATIVE mg/dL
Ketones, POC UA: NEGATIVE mg/dL
Leukocytes, UA: NEGATIVE
Nitrite, UA: POSITIVE — AB
Protein Ur, POC: NEGATIVE mg/dL
Spec Grav, UA: 1.02 (ref 1.010–1.025)
Urobilinogen, UA: 0.2 E.U./dL
pH, UA: 7 (ref 5.0–8.0)

## 2023-02-21 LAB — POCT URINE PREGNANCY: Preg Test, Ur: NEGATIVE

## 2023-02-21 MED ORDER — NITROFURANTOIN MONOHYD MACRO 100 MG PO CAPS: 100.0000 mg | ORAL_CAPSULE | Freq: Two times a day (BID) | ORAL | 0 refills | Status: DC
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
## 2023-02-21 NOTE — ED Triage Notes (Signed)
Patient with c/o odor to urine. States she has hx of frequent UTIs. Patient also requesting to be checked for STDs.

## 2023-02-21 NOTE — Discharge Instructions (Addendum)
The urinalysis showed some nitrites which could be consistent with a bladder infection.  Urine culture is sent, and staff will let you know if you need to change her antibiotic  Take nitrofurantoin 100 mg--1 capsule 2 times daily for 5 days  Will notify you if there is anything positive on the swab or the blood work

## 2023-02-23 LAB — HIV ANTIBODY (ROUTINE TESTING W REFLEX): HIV Screen 4th Generation wRfx: NONREACTIVE

## 2023-02-23 LAB — RPR: RPR Ser Ql: NONREACTIVE

## 2023-02-24 LAB — CERVICOVAGINAL ANCILLARY ONLY
Bacterial Vaginitis (gardnerella): POSITIVE — AB
Candida Glabrata: NEGATIVE
Candida Vaginitis: NEGATIVE
Chlamydia: NEGATIVE
Comment: NEGATIVE
Comment: NEGATIVE
Comment: NEGATIVE
Comment: NEGATIVE
Comment: NEGATIVE
Comment: NORMAL
Neisseria Gonorrhea: NEGATIVE
Trichomonas: NEGATIVE

## 2023-02-24 LAB — URINE CULTURE: Culture: >=100000 — AB

## 2023-02-25 ENCOUNTER — Telehealth (HOSPITAL_COMMUNITY): Payer: Self-pay | Admitting: Emergency Medicine

## 2023-02-25 MED ORDER — METRONIDAZOLE 500 MG PO TABS: 500.0000 mg | ORAL_TABLET | Freq: Two times a day (BID) | ORAL | 0 refills | Status: DC

## 2023-05-29 ENCOUNTER — Telehealth: Payer: Self-pay

## 2023-05-29 NOTE — Telephone Encounter (Signed)
Noted, will evaluate. 

## 2023-05-29 NOTE — Transitions of Care (Post Inpatient/ED Visit) (Signed)
   05/29/2023  Name: Tanya Burnett MRN: [id number] DOB: [birth year]  Today's TOC FU Call Status: Today's TOC FU Call Status:: Successful TOC FU Call Competed TOC FU Call Complete Date: 05/29/23  Transition Care Management Follow-up Telephone Call Date of Discharge: 05/28/23 Discharge Facility: Other [facility] Name of Other ([facility]) Discharge Facility: [facility] Type of Discharge: Emergency Department Reason for ED Visit: Other: How have you been since you were released from the hospital?: Same Any questions or concerns?: Yes Patient Questions/Concerns:: patient stated that she didn't get any type of treatment , that she was just told to follow up PCP or GYN Patient Questions/Concerns Addressed: Notified Provider of Patient Questions/Concerns  Items Reviewed: Did you receive and understand the discharge instructions provided?: Yes Medications obtained,verified, and reconciled?: Yes (Medications Reviewed) Any new allergies since your discharge?: No Dietary orders reviewed?: No Do you have support at home?: No  Medications Reviewed Today: Medications Reviewed Today     Reviewed by Benson Setting, RN (Registered Nurse) on 02/21/23 at 1811  Med List Status: <None>   Medication Order Taking? Sig Documenting Provider Last Dose Status Informant   Patient not taking:   Discontinued 02/21/23 1811    Patient not taking:   Discontinued 02/21/23 1811   EPINEPHrine 0.3 mg/0.3 mL IJ SOAJ injection [id number]  Inject 0.3 mg into the muscle as needed for anaphylaxis.  Patient not taking: Reported on 11/06/2022   Doreene Nest, NP  Active     Discontinued 02/21/23 1811    Patient not taking:   Discontinued 02/21/23 1811             Home Care and Equipment/Supplies: Were Home Health Services Ordered?: No Any new equipment or medical supplies ordered?: No  Functional Questionnaire: Do you need assistance with bathing/showering or dressing?: No Do you need  assistance with meal preparation?: No Do you need assistance with eating?: No Do you have difficulty maintaining continence: No Do you need assistance with getting out of bed/getting out of a chair/moving?: No Do you have difficulty managing or taking your medications?: No  Follow up appointments reviewed: PCP Follow-up appointment confirmed?: Yes Date of PCP follow-up appointment?: 05/30/23 Follow-up Provider: Mayra Reel Specialist [facility] Follow-up appointment confirmed?: No Reason Specialist Follow-Up Not Confirmed: Patient has Specialist Provider Number and will Call for Appointment Do you need transportation to your follow-up appointment?: No Do you understand care options if your condition(s) worsen?: Yes-patient verbalized understanding    SIGNATURE Fredirick Maudlin

## 2023-05-30 ENCOUNTER — Ambulatory Visit (INDEPENDENT_AMBULATORY_CARE_PROVIDER_SITE_OTHER): Payer: No Typology Code available for payment source | Admitting: Primary Care

## 2023-05-30 ENCOUNTER — Encounter: Payer: Self-pay | Admitting: Primary Care

## 2023-05-30 VITALS — BP 118/56 | HR 98 | Temp 98.7°F | Ht 62.0 in | Wt 123.0 lb

## 2023-05-30 DIAGNOSIS — F419 Anxiety disorder, unspecified: Secondary | ICD-10-CM

## 2023-05-30 DIAGNOSIS — F32A Depression, unspecified: Secondary | ICD-10-CM

## 2023-05-30 DIAGNOSIS — G43009 Migraine without aura, not intractable, without status migrainosus: Secondary | ICD-10-CM | POA: Diagnosis not present

## 2023-05-30 DIAGNOSIS — N939 Abnormal uterine and vaginal bleeding, unspecified: Secondary | ICD-10-CM | POA: Diagnosis not present

## 2023-05-30 MED ORDER — SERTRALINE HCL 25 MG PO TABS: 25.0000 mg | ORAL_TABLET | Freq: Every day | ORAL | 0 refills | Status: DC

## 2023-05-30 MED ORDER — SUMATRIPTAN SUCCINATE 50 MG PO TABS: ORAL_TABLET | ORAL | 0 refills | Status: DC

## 2023-05-30 MED ORDER — KETOROLAC TROMETHAMINE 60 MG/2ML IM SOLN
60.0000 mg | Freq: Once | INTRAMUSCULAR | Status: AC
  Administered 2023-05-30: 60 mg via INTRAMUSCULAR

## 2023-05-30 NOTE — Addendum Note (Signed)
Addended by: Doreene Nest on: 05/30/2023 03:04 PM   Modules accepted: Orders

## 2023-05-30 NOTE — Progress Notes (Addendum)
Subjective:    Patient ID: Tanya Burnett, female    DOB: 08-Feb-2001, 22 y.o.   MRN: [id number]  HPI  Maleta Pacha is a very pleasant 22 y.o. female with  a history of frequent headaches, chronic idiopathic urticaria, UTI, anxiety and depression who presents today for ED and Urgent Care follow up.  Her friend joins [place] today.  She presented to [facility] ED on 05/27/23 for abnormal uterine bleeding x 1 month. She has been bleeding heavily and passing clots. Also with a syncopal episode while at work. She has a Nexplanon implant that was placed 1 year ago. During her ED visit she underwent labs which showed a normal hemoglobin and hematocrit. UA was negative except for bleeding. She was not pregnant. She left before seeing a provider.  She presented to Urgent Care on 05/28/23 for further evaluation of abnormal uterine bleeding. She also mentioned a headache and fatigue. During her visit she was referred to Ob/GYN.   Today she continues to feel fatigued, weak, and has a migraine. Her vaginal bleeding has lightened up, she is now spotting. She denies a prior history of vaginal bleeding. She was told that it may be months before she can see OB/GYN. She now lives in [place], [place].  She's been taking propranolol ER 80 mg daily for a few months. This has helped to reduce her headaches which now occur 1-2 times monthly. Her recent migraine was triggered after her bleeding episode several days ago. She describes her headache as a "band pressure" around her head with photophobia.   She is hydrating with water and Pedialyte. Her appetite is at baseline.   She continues to feel down and depressed. She hasn't taken her Lexapro in a while as it causes her to feel "numb". She has not tried anything else for depression.      12/24/2022   12:14 PM 11/20/2021    9:07 AM 10/05/2021    8:43 AM  PHQ9 SCORE ONLY  PHQ-9 Total Score 17 1 6       Review of Systems  Constitutional:  Positive for  fatigue.  Eyes:  Positive for photophobia.  Respiratory:  Negative for shortness of breath.   Neurological:  Positive for light-headedness and headaches.  Psychiatric/Behavioral:  The patient is nervous/anxious.        Feeling down, fatigued, no motivation.         Past Medical History:  Diagnosis Date   Foul smelling urine 10/05/2021   Frequent headaches    Syncope     Social History   Socioeconomic History   Marital status: Single    Spouse name: Not on file   Number of children: Not on file   Years of education: Not on file   Highest education level: Not on file  Occupational History   Not on file  Tobacco Use   Smoking status: Never   Smokeless tobacco: Never  Vaping Use   Vaping Use: Never used  Substance and Sexual Activity   Alcohol use: Never   Drug use: Never   Sexual activity: Yes    Birth control/protection: Injection  Other Topics Concern   Not on file  Social History Narrative   ** Merged History Encounter **       Social Determinants of Health   Financial Resource Strain: Not on file  Food Insecurity: Not on file  Transportation Needs: Not on file  Physical Activity: Not on file  Stress: Not on file  Social Connections:  Not on file  Intimate Partner Violence: Not on file    History reviewed. No pertinent surgical history.  Family History  Problem Relation Age of Onset   Depression Mother    Depression Father    Alcohol abuse Father     Allergies  Allergen Reactions   Almond (Diagnostic)    Black Walnut Flavor    Hazelnut (Filbert)    Peanut (Diagnostic)    Sesame Seed (Diagnostic)     Current Outpatient Medications on File Prior to Visit  Medication Sig Dispense Refill   propranolol ER (INDERAL LA) 80 MG 24 hr capsule Take 80 mg by mouth at bedtime.     EPINEPHrine 0.3 mg/0.3 mL IJ SOAJ injection Inject 0.3 mg into the muscle as needed for anaphylaxis. (Patient not taking: Reported on 11/06/2022) 1 each 0   No current  facility-administered medications on file prior to visit.    BP (!) 118/56   Pulse 98   Temp 98.7 F (37.1 C) (Temporal)   Ht 5\' 2"  (1.575 m)   Wt 123 lb (55.8 kg)   LMP 04/27/2023 (Exact Date)   SpO2 100%   BMI 22.50 kg/m  Objective:   Physical Exam Constitutional:      General: She is not in acute distress. Cardiovascular:     Rate and Rhythm: Normal rate and regular rhythm.  Pulmonary:     Effort: Pulmonary effort is normal.     Breath sounds: Normal breath sounds.  Abdominal:     Palpations: Abdomen is soft.     Tenderness: There is abdominal tenderness in the suprapubic area.  Musculoskeletal:     Cervical back: Neck supple.  Skin:    General: Skin is warm and dry.           Assessment & Plan:  Abnormal uterine bleeding Assessment & Plan: Unclear etiology. Reviewed emergency department and urgent care notes and labs.  Fortunately, her CBC is stable and her bleeding has started to subside.  Orders placed for transvaginal/pelvic ultrasound. She will follow-up with GYN as referred.  Orders: -     US PELVIC COMPLETE WITH TRANSVAGINAL; Future  Migraine without aura and without status migrainosus, not intractable Assessment & Plan: Likely triggered by her recent abnormal uterine bleeding.  Continue propranolol ER 80 mg daily for headache prevention as this has been effective.  Prescription for sumatriptan 50 mg tablets provided to use as needed.  Discussed instructions on use. Toradol 60 mg IM provided today.  We discussed that she may want to transition her care towards the [place] area as she has moved.  Will continue to help her until she is established.  Orders: -     SUMAtriptan Succinate; Take 1 tablet by mouth at migraine onset. May repeat in 2 hours if headache persists or recurs.  Dispense: 10 tablet; Refill: 0 -     Ketorolac Tromethamine  Anxiety and depression Assessment & Plan: Uncontrolled. Lexapro causes her to feel numb.  Try  Zoloft 25 mg daily.  Follow up virtually in 1 month.     Orders: -     Sertraline HCl; Take 1 tablet (25 mg total) by mouth daily. for anxiety and depression.  Dispense: 90 tablet; Refill: 0        Doreene Nest, NP

## 2023-05-30 NOTE — Assessment & Plan Note (Signed)
Likely triggered by her recent abnormal uterine bleeding.  Continue propranolol ER 80 mg daily for headache prevention as this has been effective.  Prescription for sumatriptan 50 mg tablets provided to use as needed.  Discussed instructions on use. Toradol 60 mg IM provided today.  We discussed that she may want to transition her care towards the [place] area as she has moved.  Will continue to help her until she is established.

## 2023-05-30 NOTE — Assessment & Plan Note (Signed)
Unclear etiology. Reviewed emergency department and urgent care notes and labs.  Fortunately, her CBC is stable and her bleeding has started to subside.  Orders placed for transvaginal/pelvic ultrasound. She will follow-up with GYN as referred.

## 2023-05-30 NOTE — Addendum Note (Signed)
Addended by: Lonia Blood on: 05/30/2023 03:04 PM   Modules accepted: Orders

## 2023-05-30 NOTE — Assessment & Plan Note (Signed)
Uncontrolled. Lexapro causes her to feel numb.  Try Zoloft 25 mg daily.  Follow up virtually in 1 month.

## 2023-05-30 NOTE — Patient Instructions (Addendum)
Take sumatriptan (Imitrex) 50 mg tablets as needed for migraines.  Take 1 tablet by mouth migraine onset, may take 1 more tablet 2 hours later if migraine persist.  Do not take more than 2 tablets in 24 hours.  These may cause drowsiness.  Continue propranolol ER 80 mg daily for headache prevention.  Consider establishing with a new primary care provider in your area.  Follow-up with OB/GYN as discussed.  You will either be contacted via phone regarding your ultrasound, or you may receive a letter on your MyChart portal from our referral team with instructions for scheduling an appointment. Please let us know if you have not been contacted by anyone within two weeks.  Start sertraline (Zoloft) 25 mg daily for depression.   Set up a virtual visit with me for 1 month.  It was a pleasure to see you today!

## 2023-06-13 ENCOUNTER — Encounter: Payer: Self-pay | Admitting: *Deleted

## 2023-07-01 ENCOUNTER — Telehealth: Payer: No Typology Code available for payment source | Admitting: Primary Care

## 2023-08-13 ENCOUNTER — Telehealth: Payer: Self-pay

## 2023-08-13 NOTE — Transitions of Care (Post Inpatient/ED Visit) (Signed)
   08/13/2023  Name: Tanya Burnett MRN: [id number] DOB: September 29, 2001  Today's TOC FU Call Status: Today's TOC FU Call Status:: Successful TOC FU Call Completed TOC FU Call Complete Date: 08/13/23  Transition Care Management Follow-up Telephone Call Date of Discharge: 08/12/23 Discharge Facility: Other [facility] of Other ([facility]) [facility]charge Facility: [facility] Type of Discharge: Emergency Department Reason for ED Visit: Other: (Fever) How have you been since you were released from the hospital?: Better Any questions or concerns?: Yes Patient Questions/Concerns:: (S) still has the fever- taking tylenol- was told it could be her birth control- is wanting referral to GYN to have it taken out Patient Questions/Concerns Addressed: (S) Notified Provider of Patient Questions/Concerns  Items Reviewed: Did you receive and understand the discharge instructions provided?: Yes Medications obtained,verified, and reconciled?: Yes (Medications Reviewed) Any new allergies since your discharge?: No Dietary orders reviewed?: Yes Do you have support at home?: Yes  Medications Reviewed Today: Medications Reviewed Today     Reviewed by Merleen Nicely, LPN (Licensed Practical Nurse) on 08/13/23 at 1145  Med List Status: <None>   Medication Order Taking? Sig Documenting Provider Last Dose Status Informant  EPINEPHrine 0.3 mg/0.3 mL IJ SOAJ injection [id number] No Inject 0.3 mg into the muscle as needed for anaphylaxis.  Patient not taking: Reported on 11/06/2022   Doreene Nest, NP Not Taking Active   etonogestrel (NEXPLANON) 68 MG IMPL implant [id number] Yes Inject into the skin. [provider] Taking Active   etonogestrel (NEXPLANON) 68 MG IMPL implant [id number] Yes 68 mg by Subdermal route. [provider] Taking Active   nitrofurantoin, macrocrystal-monohydrate, (MACROBID) 100 MG capsule [id number] Yes Take 100 mg by mouth 2 (two) times  daily. [provider] Taking Active   propranolol ER (INDERAL LA) 80 MG 24 hr capsule [id number] Yes Take 80 mg by mouth at bedtime. [provider] Taking Active   sertraline (ZOLOFT) 25 MG tablet [id number] Yes Take 1 tablet (25 mg total) by mouth daily. for anxiety and depression. Doreene Nest, NP Taking Active   SUMAtriptan (IMITREX) 50 MG tablet [id number] Yes Take 1 tablet by mouth at migraine onset. May repeat in 2 hours if headache persists or recurs. Doreene Nest, NP Taking Active             Home Care and Equipment/Supplies: Were Home Health Services Ordered?: NA Any new equipment or medical supplies ordered?: NA  Functional Questionnaire: Do you need assistance with bathing/showering or dressing?: No Do you need assistance with meal preparation?: No Do you need assistance with eating?: No Do you have difficulty maintaining continence: No Do you need assistance with getting out of bed/getting out of a chair/moving?: No Do you have difficulty managing or taking your medications?: No  Follow up appointments reviewed: PCP Follow-up appointment confirmed?: (S) No (pt needs fu appt ASAP- PCP has no appts until mid Sept- will ask front desk to call pt to schedule appt with PCP or another provider) MD Provider Line Number:[phone] Given: Yes Follow-up Provider: Vernona Rieger NP Specialist Hospital Follow-up appointment confirmed?: NA Do you need transportation to your follow-up appointment?: No Do you understand care options if your condition(s) worsen?: Yes-patient verbalized understanding    SIGNATURE  Woodfin Ganja LPN [facility] Nurse Health Advisor Direct Dial [phone]

## 2023-08-14 ENCOUNTER — Encounter: Payer: Self-pay | Admitting: Nurse Practitioner

## 2023-08-14 ENCOUNTER — Ambulatory Visit: Payer: No Typology Code available for payment source | Admitting: Nurse Practitioner

## 2023-08-14 ENCOUNTER — Encounter: Payer: Self-pay | Admitting: *Deleted

## 2023-08-14 VITALS — BP 100/70 | HR 97 | Temp 97.9°F | Ht 62.0 in | Wt 120.2 lb

## 2023-08-14 DIAGNOSIS — Z09 Encounter for follow-up examination after completed treatment for conditions other than malignant neoplasm: Secondary | ICD-10-CM | POA: Diagnosis not present

## 2023-08-14 DIAGNOSIS — Z3040 Encounter for surveillance of contraceptives, unspecified: Secondary | ICD-10-CM | POA: Diagnosis not present

## 2023-08-14 DIAGNOSIS — R42 Dizziness and giddiness: Secondary | ICD-10-CM | POA: Diagnosis not present

## 2023-08-14 LAB — CBC
HCT: 37.5 % (ref 36.0–46.0)
Hemoglobin: 12.3 g/dL (ref 12.0–15.0)
MCHC: 32.9 g/dL (ref 30.0–36.0)
MCV: 88.8 fl (ref 78.0–100.0)
Platelets: 341 10*3/uL (ref 150.0–400.0)
RBC: 4.22 Mil/uL (ref 3.87–5.11)
RDW: 12.9 % (ref 11.5–15.5)
WBC: 3.8 10*3/uL — ABNORMAL LOW (ref 4.0–10.5)

## 2023-08-14 LAB — BASIC METABOLIC PANEL
BUN: 3 mg/dL — ABNORMAL LOW (ref 6–23)
CO2: 28 mEq/L (ref 19–32)
Calcium: 9.5 mg/dL (ref 8.4–10.5)
Chloride: 104 mEq/L (ref 96–112)
Creatinine, Ser: 0.58 mg/dL (ref 0.40–1.20)
GFR: 129 mL/min (ref >60.00)
Glucose, Bld: 101 mg/dL — ABNORMAL HIGH (ref 70–99)
Potassium: 3.9 mEq/L (ref 3.5–5.1)
Sodium: 139 mEq/L (ref 135–145)

## 2023-08-14 NOTE — Assessment & Plan Note (Signed)
Patient was seen at [facility] reviewed ED note and lab

## 2023-08-14 NOTE — Progress Notes (Signed)
Acute Office Visit  Subjective:     Patient ID: Tanya Burnett, female    DOB: 2001-12-09, 22 y.o.   MRN: [id number]  Chief Complaint  Patient presents with   Hospitalization Follow-up    Pt requests to have lab work done to recheck WBC. States that her numbers were low at hospital. Pt complains of still feeling lightheaded, dizzy, headache.    Contraception    Pt requests referral to have BC Nexplanon removed.     HPI Patient is in today for hosptial follow up.  Patient is a Psychologist, clinical patient  Patient was seen at the emergency department on 8/19/2024Per ER note she saw evaluation doing the flulike symptoms.  She had malaise, body ache, fever, chills, lightheadedness, nausea, 2 episodes of vomiting and sore throat.  Patient underwent workup and was noted to have a urinary tract infection.  Patient was given Rocephin in the emergency department and then put on cefdinir thereafter with outpatient ondansetron as needed.  Patient is here for follow-up   States that she is not feeling as bad but still having lightheadness, dizzy, and headaches  States that she is not usig the bathroom twice a day. States that she is drinking water and pedialyte. States that the lightheadedness/dizziness when she gets up. States that when she is standing in one spot too long and when she lays down and is a lightheadedness.   States that she did pick up the antibiotics and started them yesterday   GYn in [place] melissa mang  Review of Systems  Constitutional:  Negative for chills and fever.  Respiratory:  Negative for shortness of breath.   Cardiovascular:  Negative for chest pain.  Gastrointestinal:  Negative for abdominal pain, diarrhea, nausea and vomiting.       BM today that was watery diarrhea  Normal every day BM that is formed   Neurological:  Positive for dizziness and headaches.        Objective:    BP 100/70   Pulse 97   Temp 97.9 F (36.6 C) (Temporal)   Ht 5\' 2"  (1.575 m)    Wt 120 lb 3.2 oz (54.5 kg)   LMP 06/30/2023 (Approximate) Comment: a month ago. very irregular cycles.  SpO2 98%   BMI 21.98 kg/m    Physical Exam Vitals and nursing note reviewed.  Constitutional:      Appearance: Normal appearance.  HENT:     Right Ear: Tympanic membrane, ear canal and external ear normal.     Left Ear: Tympanic membrane, ear canal and external ear normal.     Mouth/Throat:     Mouth: Mucous membranes are moist.     Pharynx: Oropharynx is clear.  Eyes:     Extraocular Movements: Extraocular movements intact.     Pupils: Pupils are equal, round, and reactive to light.  Cardiovascular:     Rate and Rhythm: Normal rate and regular rhythm.     Heart sounds: Normal heart sounds.  Pulmonary:     Effort: Pulmonary effort is normal.     Breath sounds: Normal breath sounds.  Abdominal:     General: Bowel sounds are normal. There is no distension.     Palpations: There is no mass.     Tenderness: There is no abdominal tenderness. There is no right CVA tenderness or left CVA tenderness.     Hernia: No hernia is present.  Lymphadenopathy:     Cervical: No cervical adenopathy.  Neurological:     General:  No focal deficit present.     Mental Status: She is alert.     Deep Tendon Reflexes:     Reflex Scores:      Bicep reflexes are 2+ on the right side and 2+ on the left side.      Patellar reflexes are 2+ on the right side and 2+ on the left side.    Comments: Bilateral upper and lower extremity strength 5/5     No results found for any visits on 08/14/23.      Assessment & Plan:   Problem List Items Addressed This Visit       Other   Lightheadedness - Primary    Ongoing issue improved since hospitalization.  Do think is secondary to inadequate fluid volume will check CBC and BMP today just to make sure encouraged patient to drink at least 64 ounces of water.  Told patient I would like urine a light yellow color total clear color      Relevant Orders    CBC   Basic metabolic panel   Hospital discharge follow-up    Patient was seen at [facility] reviewed ED note and lab      Relevant Orders   CBC   Basic metabolic panel   Encounter for surveillance of contraceptives    Patient has a Nexplanon would like removed.  Made referral to GYN placed as patient states that required that.      Relevant Orders   Ambulatory referral to Gynecology    No orders of the defined types were placed in this encounter.   Return if symptoms worsen or fail to improve.  Audria Nine, NP

## 2023-08-14 NOTE — Assessment & Plan Note (Signed)
Patient has a Nexplanon would like removed.  Made referral to GYN placed as patient states that required that.

## 2023-08-14 NOTE — Assessment & Plan Note (Signed)
Ongoing issue improved since hospitalization.  Do think is secondary to inadequate fluid volume will check CBC and BMP today just to make sure encouraged patient to drink at least 64 ounces of water.  Told patient I would like urine a light yellow color total clear color

## 2023-08-14 NOTE — Patient Instructions (Signed)
Nice to see you today You need to increase your water intake. I want you to be around 64oz a day Follow up with Mayra Reel as needed/scheduled

## 2023-10-01 ENCOUNTER — Encounter: Payer: Self-pay | Admitting: Primary Care

## 2023-10-01 ENCOUNTER — Ambulatory Visit (INDEPENDENT_AMBULATORY_CARE_PROVIDER_SITE_OTHER): Payer: No Typology Code available for payment source | Admitting: Primary Care

## 2023-10-01 VITALS — BP 114/62 | HR 88 | Temp 98.2°F | Ht 62.0 in | Wt 124.0 lb

## 2023-10-01 DIAGNOSIS — L509 Urticaria, unspecified: Secondary | ICD-10-CM

## 2023-10-01 DIAGNOSIS — T7800XA Anaphylactic reaction due to unspecified food, initial encounter: Secondary | ICD-10-CM

## 2023-10-01 DIAGNOSIS — F419 Anxiety disorder, unspecified: Secondary | ICD-10-CM

## 2023-10-01 DIAGNOSIS — T7840XS Allergy, unspecified, sequela: Secondary | ICD-10-CM | POA: Diagnosis not present

## 2023-10-01 DIAGNOSIS — F32A Depression, unspecified: Secondary | ICD-10-CM

## 2023-10-01 MED ORDER — EPINEPHRINE 0.3 MG/0.3ML IJ SOAJ: 0.3000 mg | INTRAMUSCULAR | 0 refills | Status: AC | PRN

## 2023-10-01 NOTE — Progress Notes (Signed)
Subjective:    Patient ID: Tanya Burnett, female    DOB: 05-10-2001, 22 y.o.   MRN: [id number]  HPI  Tanya Burnett is a very pleasant 22 y.o. 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  She will be joining the [employer], specifically the [employer]. During her intake she was flagged for her food nut allergy, migraines, and anxiety/depression.   She is needing repeat food allergy testing as a lot of the MRE meals contain peanut oil. She underwent food allergy testing  in our office in September 2022 and tested positive for peanuts, walnuts, soybeans, hazelnut, almonds, sesame seeds. Evaluated by an allergist in September 2022, did not undergo skin prick testing.   Since her allergy testing in 2022 she has continued to eat foods that contain peanuts or peanut oil. She never stopped eating [employer] peanut butter cups and has never developed a reaction.  She also eats a [employer] regularly.there food is cooked in peanut will and she has never had a problem.  As an experiment, she ate 10 peanuts yesterday and did develop tongue itching which lasted about 15 minutes. She denies wheezing, shortness of breath, throat tightness, trouble swallowing.  She does not have an EpiPen at home.   She tested negative for alpha gal allergy previously.  She stopped her Zoloft 25 mg tablets about 6 months ago. Since then she's doing well in terms of anxiety and depression symptoms.  She denies SI/HI.      Review of Systems  HENT:  Negative for trouble swallowing.   Respiratory:  Negative for shortness of breath and wheezing.   Cardiovascular:  Negative for chest pain.  Neurological:  Positive for headaches.  Psychiatric/Behavioral:  The patient is not nervous/anxious.          Past Medical History:  Diagnosis Date   Foul smelling urine 10/05/2021   Frequent headaches     Syncope     Social History   Socioeconomic History   Marital status: Single    Spouse name: Not on file   Number of children: Not on file   Years of education: Not on file   Highest education level: Not on file  Occupational History   Not on file  Tobacco Use   Smoking status: Never   Smokeless tobacco: Never  Vaping Use   Vaping status: Never Used  Substance and Sexual Activity   Alcohol use: Never   Drug use: Never   Sexual activity: Yes    Birth control/protection: Injection  Other Topics Concern   Not on file  Social History Narrative   ** Merged History Encounter **       Social Determinants of Health   Financial Resource Strain: Not on file  Food Insecurity: Not on file  Transportation Needs: Not on file  Physical Activity: Not on file  Stress: Not on file  Social Connections: Unknown (05/07/2022)   Received from [facility], [facility]   Social Network    Social Network: Not on file  Intimate Partner Violence: Unknown (03/25/2022)   Received from [facility], [facility]   HITS    Physically Hurt: Not on file    Insult or Talk Down To: Not on file    Threaten Physical Harm: Not on file    Scream or Curse: Not on file    History reviewed. No pertinent surgical history.  Family History  Problem  Relation Age of Onset   Depression Mother    Depression Father    Alcohol abuse Father     Allergies  Allergen Reactions   Almond (Diagnostic)    Black Walnut Flavor    Hazelnut (Filbert)    Peanut (Diagnostic)    Sesame Seed (Diagnostic)     Current Outpatient Medications on File Prior to Visit  Medication Sig Dispense Refill   etonogestrel (NEXPLANON) 68 MG IMPL implant 68 mg by Subdermal route.     SUMAtriptan (IMITREX) 50 MG tablet Take 1 tablet by mouth at migraine onset. May repeat in 2 hours if headache persists or recurs. (Patient not taking: Reported on 10/01/2023) 10 tablet 0   No current facility-administered medications on file  prior to visit.    BP 114/62   Pulse 88   Temp 98.2 F (36.8 C) (Temporal)   Ht 5\' 2"  (1.575 m)   Wt 124 lb (56.2 kg)   SpO2 99%   BMI 22.68 kg/m  Objective:   Physical Exam Cardiovascular:     Rate and Rhythm: Normal rate and regular rhythm.  Pulmonary:     Effort: Pulmonary effort is normal.     Breath sounds: Normal breath sounds.  Musculoskeletal:     Cervical back: Neck supple.  Skin:    General: Skin is warm and dry.  Neurological:     Mental Status: She is alert and oriented to person, place, and time.  Psychiatric:        Mood and Affect: Mood normal.           Assessment & Plan:  Allergy with anaphylaxis due to food Assessment & Plan: Repeat food allergy testing pending today. Referral placed to allergist for oral challenge.  Refill provided for EpiPen today.  Recommended she avoid eating peanuts given her mild reaction yesterday, until evaluated by the allergist.  Orders: -     Food Allergy Profile  Allergic reaction, sequela -     Ambulatory referral to Allergy -     EPINEPHrine; Inject 0.3 mg into the muscle as needed for anaphylaxis.  Dispense: 1 each; Refill: 0 -     Food Allergy Profile  Full body hives -     Ambulatory referral to Allergy -     EPINEPHrine; Inject 0.3 mg into the muscle as needed for anaphylaxis.  Dispense: 1 each; Refill: 0  Anxiety and depression Assessment & Plan: Controlled per patient. Remain off Zoloft 25 mg daily.         Doreene Nest, NP

## 2023-10-01 NOTE — Assessment & Plan Note (Signed)
Controlled per patient. Remain off Zoloft 25 mg daily.

## 2023-10-01 NOTE — Patient Instructions (Addendum)
Stop by the lab prior to leaving today. I will notify you of your results once received.   You will either be contacted via phone regarding your referral to the allergist, or you may receive a letter on your MyChart portal from our referral team with instructions for scheduling an appointment. Please let us know if you have not been contacted by anyone within two weeks.  Pick up the EpiPen refill from your pharmacy.  It was a pleasure to see you today!

## 2023-10-01 NOTE — Assessment & Plan Note (Signed)
Repeat food allergy testing pending today. Referral placed to allergist for oral challenge.  Refill provided for EpiPen today.  Recommended she avoid eating peanuts given her mild reaction yesterday, until evaluated by the allergist.

## 2023-10-02 LAB — FOOD ALLERGY PROFILE
Allergen, Salmon, f41: <0.1 kU/L
Almonds: 0.51 kU/L — ABNORMAL HIGH
CLASS: 0
CLASS: 0
CLASS: 0
CLASS: 0
CLASS: 0
CLASS: 1
CLASS: 1
CLASS: 2
CLASS: 2
Cashew IgE: <0.1 kU/L
Class: 0
Class: 0
Class: 0
Class: 2
Egg White IgE: <0.1 kU/L
Fish Cod: <0.1 kU/L
Hazelnut: 2.79 kU/L — ABNORMAL HIGH
Milk IgE: <0.1 kU/L
Peanut IgE: 1.66 kU/L — ABNORMAL HIGH
Scallop IgE: 0.12 kU/L — ABNORMAL HIGH
Sesame Seed f10: 0.32 kU/L — ABNORMAL HIGH
Shrimp IgE: <0.1 kU/L
Soybean IgE: 0.45 kU/L — ABNORMAL HIGH
Tuna IgE: <0.1 kU/L
Walnut: 3.4 kU/L — ABNORMAL HIGH
Wheat IgE: <0.1 kU/L

## 2023-10-02 LAB — INTERPRETATION:

## 2023-10-08 ENCOUNTER — Telehealth: Payer: Self-pay | Admitting: Family

## 2023-10-08 ENCOUNTER — Ambulatory Visit (INDEPENDENT_AMBULATORY_CARE_PROVIDER_SITE_OTHER): Payer: No Typology Code available for payment source | Admitting: Internal Medicine

## 2023-10-08 ENCOUNTER — Encounter: Payer: Self-pay | Admitting: Internal Medicine

## 2023-10-08 ENCOUNTER — Other Ambulatory Visit: Payer: Self-pay

## 2023-10-08 VITALS — BP 118/64 | HR 71 | Temp 97.8°F | Resp 20 | Ht 62.5 in | Wt 123.0 lb

## 2023-10-08 DIAGNOSIS — J302 Other seasonal allergic rhinitis: Secondary | ICD-10-CM

## 2023-10-08 DIAGNOSIS — L272 Dermatitis due to ingested food: Secondary | ICD-10-CM | POA: Diagnosis not present

## 2023-10-08 DIAGNOSIS — J3089 Other allergic rhinitis: Secondary | ICD-10-CM

## 2023-10-08 MED ORDER — AZELASTINE HCL 0.1 % NA SOLN: 1.0000 | Freq: Two times a day (BID) | NASAL | 5 refills | Status: DC | PRN

## 2023-10-08 NOTE — Patient Instructions (Addendum)
Allergic Rhinitis:   - sIgE 2022 positive to grasses, weeds, trees, dust mites  - Avoidance measures discussed. - Use nasal saline rinses before nose sprays such as with Neilmed Sinus Rinse.  Use distilled water.   - Use Azelastine 1-2 sprays each nostril twice daily as needed for runny nose, drainage, sneezing, congestion. Aim upward and outward. - Use Zyrtec 10 mg daily as needed.   History of Idiopathic Urticaria (Hives) - If this recurs, can use Zyrtec 10mg  daily. If hives still persist, increase to Zyrtec 10mg  twice daily.   History of Food Allergy - she is already eating peanuts, treenuts, sesame, soy without any issues.  However, has positive testing and a history of food allergies.  For [employer] purpose, we will do in office oral challenge as food allergies are defined by history, not just positive testing.   - Needs to set up separate food challenges for mixed treenut (we will provide), peanuts (bring peanut butter), sesame (bring tahini) and soy (bring soymilk).  - Hold all anti histamines 3 days prior to challenge visits.    Follow up: 4 separate food challenges with NP

## 2023-10-08 NOTE — Progress Notes (Signed)
FOLLOW UP Date of Service/Encounter:  10/08/23   Subjective:  Tanya Burnett (DOB: 18-Aug-2001) is a 22 y.o. female [facility] for follow up for food allergies, allergic rhinitis and idiopathic urticaria.   History obtained from: chart review and patient. Last seen by Dr Maurine Minister on 09/05/2021 for nuts and sesame allergy.  sIgE positive to grasses, trees, weeds, dust mites. sIgE 08/2021 positive to peanut, walnut, soy, hazelnut, almonds. sIgE 09/2023 positive to walnut, soy, hazelnut, almonds, sesame.   Since last visit, she denies any recurrence of hives. In terms of foods, she eats [employer] peanut butter and treenut based trail mix without issues.  Also eats sesame seeds on sesame chicken and processed foods with soy.  Denies any reactions.  She is applying for the [employer] and the history of food allergies was flagged.  History of these food allergies is unclear, maybe some rashes.  In terms of rhinitis, doing well.  Does not have much congestion, drainage, runny nose.    Past Medical History: Past Medical History:  Diagnosis Date   Foul smelling urine 10/05/2021   Frequent headaches    Syncope     Objective:  BP 118/64 (BP Location: Left Arm, Patient Position: Sitting, Cuff Size: Normal)   Pulse 71   Temp 97.8 F (36.6 C) (Temporal)   Resp 20   Ht 5' 2.5" (1.588 m)   Wt 123 lb (55.8 kg)   SpO2 100%   BMI 22.14 kg/m  Body mass index is 22.14 kg/m. Physical Exam: GEN: alert, well developed HEENT: clear conjunctiva, nose with mild inferior turbinate hypertrophy, pink nasal mucosa, clear rhinorrhea, no cobblestoning HEART: regular rate and rhythm, no murmur LUNGS: clear to auscultation bilaterally, no coughing, unlabored respiration SKIN: no rashes or lesions  Skin Testing:  Skin prick testing was placed, which includes aeroallergens/foods, histamine control, and saline control.  Verbal consent was obtained prior to placing  test.  Patient tolerated procedure well.  Allergy testing results were read and interpreted by myself, documented by clinical staff. Adequate positive and negative control.  Positive results to:  Results discussed with patient/family.  Food Adult Perc - 10/08/23 1400     Time Antigen Placed 1431    Allergen Manufacturer Waynette Buttery    Location Back    Number of allergen test 12    Control-Histamine 3+    1. Peanut --   9x6   2. Soybean Negative    4. Sesame Negative    10. Cashew Negative    11. Walnut Food Negative    12. Almond Negative    13. Hazelnut --   10x5   14. Pecan Food Negative    15. Pistachio Negative    16. [place] Nut Negative              Assessment:   1. Seasonal and perennial allergic rhinitis   2. Dermatitis due to ingested food     Plan/Recommendations:  Allergic Rhinitis:   - sIgE 2022 positive to grasses, weeds, trees, dust mites  - Avoidance measures discussed. - Use nasal saline rinses before nose sprays such as with Neilmed Sinus Rinse.  Use distilled water.   - Use Azelastine 1-2 sprays each nostril twice daily as needed for runny nose, drainage, sneezing, congestion. Aim upward and outward. - Use Zyrtec 10 mg daily as needed.   History of Idiopathic Urticaria (Hives) - If this recurs, can use Zyrtec 10mg  daily. If hives still persist, increase  to Zyrtec 10mg  twice daily.   History of Food Allergy - she is already eating peanuts, treenuts, sesame, soy without any issues.  However, has positive testing and a listed history of food allergies.  For [employer] purpose, we will do in office oral challenge as food allergies are defined by history, not just positive testing.   - Needs to set up separate food challenges for mixed treenut (we will provide), peanuts (bring peanut butter), sesame (bring tahini) and soy (bring soymilk).  - Hold all anti histamines 3 days prior to challenge visits.    Follow up: 4 separate food challenges with  NP    Return if symptoms worsen or fail to improve.  Alesia Morin, MD [facility]

## 2023-10-08 NOTE — Telephone Encounter (Signed)
Patient has a soymilk challenge scheduled with Chrissie on November 5th at 8:30am  Please mail out Challenge instruction to patient.  Best [phone]

## 2023-10-27 NOTE — Patient Instructions (Signed)
Tanya Burnett was able to tolerate the soy milk food challenge today at the office without adverse signs or symptoms of an allergic reaction. Therefore, she has the same risk of systemic reaction associated with the consumption of soy products as the general population.  - Do not give any soy products for the next 24 hours. - Monitor for allergic symptoms such as rash, wheezing, diarrhea, swelling, and vomiting for the next 24 hours. If severe symptoms occur, treat with EpiPen injection and call 911. For less severe symptoms treat with Benadryl 4 teaspoonfuls every 6 hours and call the clinic.  - If no allergic symptoms are evident, reintroduce soy products into the diet, 1-2 servings a day. If she develops an allergic reaction to soy products, record what was eaten, the amount eaten, preparation method, time from ingestion to reaction, and symptoms.   - Needs to set up separate food challenges for mixed treenut (we will provide), peanuts (bring peanut butter),  and sesame (bring tahini)  - Hold all anti histamines 3 days prior to challenge visits.

## 2023-10-28 ENCOUNTER — Ambulatory Visit (INDEPENDENT_AMBULATORY_CARE_PROVIDER_SITE_OTHER): Payer: No Typology Code available for payment source | Admitting: Family

## 2023-10-28 ENCOUNTER — Encounter: Payer: Self-pay | Admitting: Family

## 2023-10-28 ENCOUNTER — Other Ambulatory Visit: Payer: Self-pay

## 2023-10-28 VITALS — BP 120/60 | HR 91 | Temp 98.0°F | Resp 16 | Wt 124.9 lb

## 2023-10-28 DIAGNOSIS — L272 Dermatitis due to ingested food: Secondary | ICD-10-CM | POA: Diagnosis not present

## 2023-10-28 NOTE — Progress Notes (Signed)
[address] [place] [address] Dept: [phone]  FOLLOW UP NOTE  Patient ID: Tanya Burnett, female    DOB: 03/20/2001  Age: 22 y.o. MRN: [id number] Date of Office Visit: 10/28/2023  Assessment  Chief Complaint: Food/Drug Challenge (Soy milk)  HPI Tanya Burnett is a 22 year old female who presents today for an oral food challenge to soy milk.  She was last seen on October 08, 2023 by Dr. Allena Katz for seasonal and perennial allergic rhinitis and dermatitis due to ingestion of food.  She denies any new diagnosis or surgery since her last office visit.  Her sister is here with her today.  She reports that she eats processed foods with soy without any problems.  She is applying for the [employer] and the history of food allergies was flagged.  History of these food allergies is unclear, may be some rashes.  She reports that she has been off all antihistamines for the past 3 days and is in good health.  She denies any cardiorespiratory, gastrointestinal, and cutaneous symptoms.  She reports that she did not eat breakfast this morning and wonders if she can eat oatmeal.  She reports that she eats that well without any problems.   Drug Allergies:  Allergies  Allergen Reactions   Hazelnut (Filbert)    Peanut (Diagnostic)    Sesame Seed (Diagnostic)     Review of Systems: Negative except as per HPI   Physical Exam: BP 120/60   Pulse 91   Temp 98 F (36.7 C) (Temporal)   Resp 16   Wt 124 lb 14.4 oz (56.7 kg)   SpO2 100%   BMI 22.48 kg/m    Physical Exam Exam conducted with a chaperone present (sister present).  Constitutional:      Appearance: Normal appearance.  HENT:     Head: Normocephalic and atraumatic.     Comments: Pharynx normal. Eyes normal. Ears normal. Nose: bilateral lower turbinates moderately edematous and slightly erythematous with no drainage noted.    Right Ear: Tympanic membrane, ear canal and external ear normal.     Left Ear: Tympanic membrane, ear  canal and external ear normal.     Mouth/Throat:     Mouth: Mucous membranes are moist.     Pharynx: Oropharynx is clear.  Eyes:     Conjunctiva/sclera: Conjunctivae normal.  Cardiovascular:     Rate and Rhythm: Regular rhythm.     Heart sounds: Normal heart sounds.  Pulmonary:     Effort: Pulmonary effort is normal.     Breath sounds: Normal breath sounds.     Comments: Lungs clear to auscultation Musculoskeletal:     Cervical back: Neck supple.  Skin:    General: Skin is warm.  Neurological:     Mental Status: She is alert and oriented to person, place, and time.  Psychiatric:        Mood and Affect: Mood normal.        Behavior: Behavior normal.        Thought Content: Thought content normal.        Judgment: Judgment normal.     Diagnostics:  Open graded soy milk oral challenge: The patient was able to tolerate the challenge today without adverse signs or symptoms. Vital signs were stable throughout the challenge and observation period. She received multiple doses separated by 15 minutes, each of which was separated by vitals and a brief physical exam. She received the following doses: lip rub, 5 mL, 15 mL, 35 mL,  55 mL, and 70 mL. She was monitored for 60 minutes following the last dose.   The patient had negative skin prick tests to soybean and IgE soybean 0.45  and was able to tolerate the open graded oral challenge today without adverse signs or symptoms. Therefore, she has the same risk of systemic reaction associated with the consumption of soy  as the general population.   Assessment and Plan: 1. Dermatitis due to ingested food     No orders of the defined types were placed in this encounter.   Patient Instructions  Osha was able to tolerate the soy milk food challenge today at the office without adverse signs or symptoms of an allergic reaction. Therefore, she has the same risk of systemic reaction associated with the consumption of soy products as the general  population.  - Do not give any soy products for the next 24 hours. - Monitor for allergic symptoms such as rash, wheezing, diarrhea, swelling, and vomiting for the next 24 hours. If severe symptoms occur, treat with EpiPen injection and call 911. For less severe symptoms treat with Benadryl 4 teaspoonfuls every 6 hours and call the clinic.  - If no allergic symptoms are evident, reintroduce soy products into the diet, 1-2 servings a day. If she develops an allergic reaction to soy products, record what was eaten, the amount eaten, preparation method, time from ingestion to reaction, and symptoms.   - Needs to set up separate food challenges for mixed treenut (we will provide), peanuts (bring peanut butter),  and sesame (bring tahini)  - Hold all anti histamines 3 days prior to challenge visits.  Return if symptoms worsen or fail to improve, for food challenge.    Thank you for the opportunity to care for this patient.  Please do not hesitate to contact me with questions.  Nehemiah Settle, FNP [facility]

## 2023-10-30 NOTE — Patient Instructions (Addendum)
Tanya Burnett was able to tolerate the  tahini (sesame) food challenge today at the office without adverse signs or symptoms of an allergic reaction. Therefore, she has the same risk of systemic reaction associated with the consumption of sesame products as the general population.  - Do not give any sesame products for the next 24 hours. - Monitor for allergic symptoms such as rash, wheezing, diarrhea, swelling, and vomiting for the next 24 hours. If severe symptoms occur, treat with EpiPen injection and call 911. For less severe symptoms treat with Benadryl 4 teaspoonfuls every 6 hours and call the clinic.  - If no allergic symptoms are evident, reintroduce sesame products into the diet, 1-2 servings a day. If she develops an allergic reaction to sesame products, record what was eaten, the amount eaten, preparation method, time from ingestion to reaction, and symptoms.   Keep already scheduled appointment on Monday, November 11 at 2 PM in [facility] for an oral food challenge to mixed tree nut butter.  (We will have the mixed tree nut better).  You will need to be off all antihistamines 3 days prior to this appointment.

## 2023-10-31 ENCOUNTER — Encounter: Payer: Self-pay | Admitting: Family

## 2023-10-31 ENCOUNTER — Ambulatory Visit (INDEPENDENT_AMBULATORY_CARE_PROVIDER_SITE_OTHER): Payer: No Typology Code available for payment source | Admitting: Family

## 2023-10-31 ENCOUNTER — Other Ambulatory Visit: Payer: Self-pay

## 2023-10-31 ENCOUNTER — Ambulatory Visit: Payer: Self-pay | Admitting: Internal Medicine

## 2023-10-31 VITALS — BP 114/76 | HR 70 | Temp 98.3°F | Resp 20 | Wt 127.4 lb

## 2023-10-31 DIAGNOSIS — L272 Dermatitis due to ingested food: Secondary | ICD-10-CM | POA: Diagnosis not present

## 2023-10-31 NOTE — Patient Instructions (Incomplete)
Tanya Burnett was able to tolerate the mixed tree nut butter food challenge today at the office without adverse sighn or symptoms of an allergic reaction. Therefore, she has the same risk of systemic reaction associated with the consumption of tree nut products as the general population.  - Do not give any tree nut products for the next 24 hours. - Monitor for allergic symptoms such as rash, wheezing, diarrhea, swelling, and vomiting for the next 24 hours. If severe symptoms occur, treat with EpiPen injection and call 911. For less severe symptoms treat with Benadryl 4 teaspoonfuls every 6 hours and call the clinic.  - If no allergic symptoms are evident, reintroduce tree nut products into the diet, 1-2 servings a day. If she develops an allergic reaction to tree nut products, record what was eaten, the amount eaten, preparation method, time from ingestion to reaction, and symptoms.   Keep your upcoming appointment (oral food challenge to peanut butter) on November 06, 2023 at 8:30 in the morning.  Remember to bring peanut butter with you to this challenge.  You will need to be off all antihistamines for the past 3 days and in good health.

## 2023-10-31 NOTE — Progress Notes (Signed)
[address] [place] [address] Dept: [phone]  FOLLOW UP NOTE  Patient ID: Tanya Burnett, female    DOB: 05/02/2001  Age: 22 y.o. MRN: [id number] Date of Office Visit: 10/31/2023  Assessment  Chief Complaint: Food/Drug Challenge (Sesame food challenge, healthy, not an any antihistamine)  HPI Tanya Burnett is a 22 year old female who presents today for an oral food challenge to tahini.  She was last seen on October 28, 2023 by myself where she passed the soy milk challenge.  She denies any new diagnosis or surgeries since her last office visit  History of her food allergies are unclear, may be some rashes.  She is able to eat sesame seeds on sesame chicken without any problems.  She reports that she has been off all antihistamines for the past 3 days and is in good health.  She denies any cardiorespiratory, gastrointestinal, and cutaneous symptoms.  All questions answered and informed consent signed.  She is applying to the [employer] and these food allergies were flagged.   Drug Allergies:  Allergies  Allergen Reactions   Hazelnut (Filbert)    Peanut (Diagnostic)    Sesame Seed (Diagnostic)     Review of Systems: Negative except as per HPI  Physical Exam: BP 118/72 (BP Location: Left Arm, Patient Position: Sitting, Cuff Size: Normal)   Pulse 88   Temp 98.3 F (36.8 C) (Oral)   Resp 20   Wt 127 lb 6.4 oz (57.8 kg)   SpO2 96%   BMI 22.93 kg/m    Physical Exam Constitutional:      Appearance: Normal appearance.  HENT:     Head: Normocephalic and atraumatic.     Comments: Pharynx normal. Eyes normal. Ears normal. Nose: bilateral lower turbinates mildly edematous and slightly erythematous with no drainage noted.    Right Ear: Tympanic membrane, ear canal and external ear normal.     Left Ear: Tympanic membrane, ear canal and external ear normal.     Mouth/Throat:     Mouth: Mucous membranes are moist.     Pharynx: Oropharynx is clear.  Eyes:      Conjunctiva/sclera: Conjunctivae normal.  Cardiovascular:     Rate and Rhythm: Regular rhythm.     Heart sounds: Normal heart sounds.  Pulmonary:     Effort: Pulmonary effort is normal.     Breath sounds: Normal breath sounds.     Comments: Lungs clear to auscultation Musculoskeletal:     Cervical back: Neck supple.  Skin:    General: Skin is warm.     Comments: No rashes or urticarial lesions noted.  Neurological:     Mental Status: She is alert and oriented to person, place, and time.  Psychiatric:        Mood and Affect: Mood normal.        Behavior: Behavior normal.        Thought Content: Thought content normal.        Judgment: Judgment normal.     Diagnostics:  Open graded tahini oral challenge: The patient was able to tolerate the challenge today without adverse signs or symptoms. Vital signs were stable throughout the challenge and observation period. She received multiple doses separated by 15 minutes, each of which was separated by vitals and a brief physical exam. She received the following doses: lip rub,1/12 serving, 1/6 serving, 1/4 serving, and 1/2 serving.total goal:1 tablespoon. She was monitored for 60 minutes following the last dose.   The patient had negative skin  prick tests to sesame IgE sesame seed 0.32 kU/L  and was able to tolerate the open graded oral challenge today without adverse signs or symptoms. Therefore, she has the same risk of systemic reaction associated with the consumption of sesame  as the general population.   Assessment and Plan: 1. Dermatitis due to ingested food     No orders of the defined types were placed in this encounter.   Patient Instructions  Tanya Burnett was able to tolerate the  tahini (sesame) food challenge today at the office without adverse signs or symptoms of an allergic reaction. Therefore, she has the same risk of systemic reaction associated with the consumption of sesame products as the general population.  - Do not  give any sesame products for the next 24 hours. - Monitor for allergic symptoms such as rash, wheezing, diarrhea, swelling, and vomiting for the next 24 hours. If severe symptoms occur, treat with EpiPen injection and call 911. For less severe symptoms treat with Benadryl 4 teaspoonfuls every 6 hours and call the clinic.  - If no allergic symptoms are evident, reintroduce sesame products into the diet, 1-2 servings a day. If she develops an allergic reaction to sesame products, record what was eaten, the amount eaten, preparation method, time from ingestion to reaction, and symptoms.   Keep already scheduled appointment on Monday, November 11 at 2 PM in [facility] for an oral food challenge to mixed tree nut butter.  (We will have the mixed tree nut better).  You will need to be off all antihistamines 3 days prior to this appointment.   Return in about 3 days (around 11/03/2023) for food challenge-mixed tree nut butter.    Thank you for the opportunity to care for this patient.  Please do not hesitate to contact me with questions.  Nehemiah Settle, FNP [facility]

## 2023-11-03 ENCOUNTER — Ambulatory Visit (INDEPENDENT_AMBULATORY_CARE_PROVIDER_SITE_OTHER): Payer: No Typology Code available for payment source | Admitting: Family

## 2023-11-03 ENCOUNTER — Encounter: Payer: Self-pay | Admitting: Family

## 2023-11-03 VITALS — BP 108/66 | HR 78 | Temp 97.9°F | Resp 16

## 2023-11-03 DIAGNOSIS — L272 Dermatitis due to ingested food: Secondary | ICD-10-CM

## 2023-11-03 NOTE — Progress Notes (Signed)
[address] [place] [address] Dept: [phone]  FOLLOW UP NOTE  Patient ID: Tanya Burnett, female    DOB: 07/11/2001  Age: 22 y.o. MRN: [id number] Date of Office Visit: 11/03/2023  Assessment  Chief Complaint: Food/Drug Challenge (Tree nut butter)  HPI Tanya Burnett is a 22 year old female who presents today for an oral food challenge to mixed tree nut butter.  She was last seen on October 31, 2023 by myself where she passed the tahini challenge.  She denies any new diagnosis or surgery since her last office visit.   She has a history of food allergies that are not clear.  She maybe had some rashes.  She is able to eat tree nut based trail mix without any issues.  She has been off all antihistamines for the past 3 days and is in good health.  She denies any cardiorespiratory, gastrointestinal, and cutaneous symptoms.  All questions answered and informed consent signed.  She is applying to the [employer] and these food allergies were flagged.   Drug Allergies:  Allergies  Allergen Reactions   Peanut (Diagnostic)     Review of Systems: Negative except as per HPI  Physical Exam: BP 108/66   Pulse 78   Temp 97.9 F (36.6 C) (Temporal)   Resp 16   SpO2 100%    Physical Exam Constitutional:      Appearance: Normal appearance.  HENT:     Head: Normocephalic and atraumatic.     Comments: Pharynx normal, eyes normal, ears normal, nose: Bilateral lower turbinates mildly edematous and pale with no drainage noted    Right Ear: Tympanic membrane, ear canal and external ear normal.     Left Ear: Tympanic membrane, ear canal and external ear normal.     Mouth/Throat:     Mouth: Mucous membranes are moist.     Pharynx: Oropharynx is clear.  Eyes:     Conjunctiva/sclera: Conjunctivae normal.  Cardiovascular:     Rate and Rhythm: Regular rhythm.     Heart sounds: Normal heart sounds.  Pulmonary:     Effort: Pulmonary effort is normal.     Breath sounds: Normal breath  sounds.     Comments: Lungs clear to auscultation Musculoskeletal:     Cervical back: Neck supple.  Skin:    General: Skin is warm.     Comments: No rashes or urticarial lesions noted  Neurological:     Mental Status: She is alert and oriented to person, place, and time.  Psychiatric:        Mood and Affect: Mood normal.        Behavior: Behavior normal.        Thought Content: Thought content normal.        Judgment: Judgment normal.     Diagnostics: Open graded mixed tree nut butter oral challenge: The patient was able to tolerate the challenge today without adverse signs or symptoms. Vital signs were stable throughout the challenge and observation period. She received multiple doses separated by 15 minutes, each of which was separated by vitals and a brief physical exam. She received the following doses: lip rub, 1 gm, 2 gm, 4 gm, 8 gm, and 16 gm. She was monitored for 60 minutes following the last dose.   The patient had  positive skin testing to  hazelnut (10x5), IgE hazelnut 2.79, skin testing to cashew negative, IgE cashes< 0.10,  skin test to walnut negative, IgE walnut 3.40, skin test to almond negative, IgE  almond  0.51   and was able to tolerate the open graded oral challenge today without adverse signs or symptoms. Therefore, she has the same risk of systemic reaction associated with the consumption of tree nuts  as the general population.    Assessment and Plan: 1. Dermatitis due to ingested food     No orders of the defined types were placed in this encounter.   Patient Instructions  Tanya Burnett was able to tolerate the mixed tree nut butter food challenge today at the office without adverse sighn or symptoms of an allergic reaction. Therefore, she has the same risk of systemic reaction associated with the consumption of tree nut products as the general population.  - Do not give any tree nut products for the next 24 hours. - Monitor for allergic symptoms such as rash,  wheezing, diarrhea, swelling, and vomiting for the next 24 hours. If severe symptoms occur, treat with EpiPen injection and call 911. For less severe symptoms treat with Benadryl 4 teaspoonfuls every 6 hours and call the clinic.  - If no allergic symptoms are evident, reintroduce tree nut products into the diet, 1-2 servings a day. If she develops an allergic reaction to tree nut products, record what was eaten, the amount eaten, preparation method, time from ingestion to reaction, and symptoms.   Keep your upcoming appointment (oral food challenge to peanut butter) on November 06, 2023 at 8:30 in the morning.  Remember to bring peanut butter with you to this challenge.  You will need to be off all antihistamines for the past 3 days and in good health.  Return in about 2 days (around 11/05/2023), or if symptoms worsen or fail to improve, for food challenge-peanut butter.    Thank you for the opportunity to care for this patient.  Please do not hesitate to contact me with questions.  Nehemiah Settle, FNP [facility]

## 2023-11-04 NOTE — Patient Instructions (Signed)
Tanya Burnett was able to tolerate the peanut butter food challenge today at the office without adverse signs or symptoms of an allergic reaction. Therefore, she has the same risk of systemic reaction associated with the consumption of peanut products as the general population.  - Do not give any peanut products for the next 24 hours. - Monitor for allergic symptoms such as rash, wheezing, diarrhea, swelling, and vomiting for the next 24 hours. If severe symptoms occur, treat with EpiPen injection and call 911. For less severe symptoms treat with Benadryl 4 teaspoonfuls every 6 hours and call the clinic.  - If no allergic symptoms are evident, reintroduce peanut products into the diet, 1-2 servings a day. If she develops an allergic reaction to peanut products, record what was eaten, the amount eaten, preparation method, time from ingestion to reaction, and symptoms.     According to DoDi 6130.03 section 6.23 Food allergy is disqualifying if patient has a "History of acute allergic reaction to fish, crustaceans, shellfish, peanuts, or tree nuts  including the presence of a food-specific immunoglobulin E antibody if accompanied by a  correlating clinical history".  However, patient was able to pass  in-office oral challenges to soy, sesame, tree nuts, and peanut on 10/28/23, 10/31/23, 11/03/23 and  11/06/23, therefore ruling out soy, sesame, tree nuts, and peanut allergy.  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  No indication for epipen, no indication for food avoidance.  She is cleared of these allergies and does not need any allergy follow up.

## 2023-11-06 ENCOUNTER — Ambulatory Visit (INDEPENDENT_AMBULATORY_CARE_PROVIDER_SITE_OTHER): Payer: No Typology Code available for payment source | Admitting: Family

## 2023-11-06 ENCOUNTER — Other Ambulatory Visit: Payer: Self-pay

## 2023-11-06 ENCOUNTER — Encounter: Payer: Self-pay | Admitting: Family

## 2023-11-06 VITALS — BP 110/76 | HR 68 | Temp 97.7°F | Resp 20 | Ht 62.0 in | Wt 125.0 lb

## 2023-11-06 DIAGNOSIS — L272 Dermatitis due to ingested food: Secondary | ICD-10-CM

## 2023-11-06 NOTE — Progress Notes (Signed)
[address] [place] [address] Dept: [phone]  FOLLOW UP NOTE  Patient ID: Tanya Burnett, female    DOB: January 27, 2001  Age: 22 y.o. MRN: [id number] Date of Office Visit: 11/06/2023  Assessment  Chief Complaint: Follow-up and Food/Drug Challenge (Peanut buttter)  HPI Zanylah Burnett is a 22 year old female who presents today for oral food challenge to peanut butter.  She was last seen by myself on November 03, 2023 where she passed the mixed nut butter challenge.  She denies any new diagnosis or surgery since her last office visit.  She has a history of food allergies that are not clear.  She maybe had some rashes.  She reports that she is able to eat peanuts and [employer] peanut butter cups without any problems.  She has been off all antihistamines for the past 3 days and is good health.  She denies any cardiorespiratory, gastrointestinal, and cutaneous symptoms.  All questions answered and informed consent signed.  She is applying to the [employer] and these food allergies were flagged.   Drug Allergies:  No Active Allergies   Review of Systems: Negative except as per HPI   Physical Exam: BP 110/76 (BP Location: Left Arm, Patient Position: Sitting, Cuff Size: Normal)   Pulse 68   Temp 97.7 F (36.5 C) (Temporal)   Resp 20   Ht 5\' 2"  (1.575 m)   Wt 125 lb (56.7 kg)   SpO2 100%   BMI 22.86 kg/m    Physical Exam Constitutional:      Appearance: Normal appearance.  HENT:     Head: Normocephalic and atraumatic.     Comments: Pharynx normal, eyes normal, ears normal, nose: Bilateral lower turbinates moderately edematous and slightly erythematous with no drainage noted    Right Ear: Tympanic membrane, ear canal and external ear normal.     Left Ear: Tympanic membrane, ear canal and external ear normal.     Mouth/Throat:     Mouth: Mucous membranes are moist.     Pharynx: Oropharynx is clear.  Eyes:     Conjunctiva/sclera: Conjunctivae normal.  Cardiovascular:      Rate and Rhythm: Regular rhythm.     Heart sounds: Normal heart sounds.  Pulmonary:     Effort: Pulmonary effort is normal.     Breath sounds: Normal breath sounds.     Comments: Lungs clear to auscultation Musculoskeletal:     Cervical back: Neck supple.  Skin:    General: Skin is warm.     Comments: No rashes or urticarial lesions noted  Neurological:     Mental Status: She is alert and oriented to person, place, and time.  Psychiatric:        Mood and Affect: Mood normal.        Behavior: Behavior normal.        Thought Content: Thought content normal.        Judgment: Judgment normal.     Diagnostics:  Open graded peanut butter oral challenge: The patient was able to tolerate the challenge today without adverse signs or symptoms. Vital signs were stable throughout the challenge and observation period. She received multiple doses separated by 15 minutes, each of which was separated by vitals and a brief physical exam. She received the following doses: lip rub, 1 gm, 2 gm, 4 gm, 8 gm, and 16 gm. She was monitored for 60 minutes following the last dose.   The patient had  skin prick test to peanut  9x6 and  Ige peanut 1.66  and was able to tolerate the open graded oral challenge today without adverse signs or symptoms. Therefore, she has the same risk of systemic reaction associated with the consumption of peanut  as the general population.   Assessment and Plan: 1. Dermatitis due to ingested food     No orders of the defined types were placed in this encounter.   Patient Instructions  Tanya Burnett was able to tolerate the peanut butter food challenge today at the office without adverse signs or symptoms of an allergic reaction. Therefore, she has the same risk of systemic reaction associated with the consumption of peanut products as the general population.  - Do not give any peanut products for the next 24 hours. - Monitor for allergic symptoms such as rash, wheezing, diarrhea,  swelling, and vomiting for the next 24 hours. If severe symptoms occur, treat with EpiPen injection and call 911. For less severe symptoms treat with Benadryl 4 teaspoonfuls every 6 hours and call the clinic.  - If no allergic symptoms are evident, reintroduce peanut products into the diet, 1-2 servings a day. If she develops an allergic reaction to peanut products, record what was eaten, the amount eaten, preparation method, time from ingestion to reaction, and symptoms.     According to DoDi 6130.03 section 6.23 Food allergy is disqualifying if patient has a "History of acute allergic reaction to fish, crustaceans, shellfish, peanuts, or tree nuts  including the presence of a food-specific immunoglobulin E antibody if accompanied by a  correlating clinical history".  However, patient was able to pass  in-office oral challenges to soy, sesame, tree nuts, and peanut on 10/28/23, 10/31/23, 11/03/23 and  11/06/23, therefore ruling out soy, sesame, tree nuts, and peanut allergy.  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  No indication for epipen, no indication for food avoidance.  She is cleared of these allergies and does not need any allergy follow up.    Return if symptoms worsen or fail to improve.    Thank you for the opportunity to care for this patient.  Please do not hesitate to contact me with questions.  Nehemiah Settle, FNP [facility]

## 2023-12-22 ENCOUNTER — Encounter: Payer: Self-pay | Admitting: Primary Care

## 2023-12-22 ENCOUNTER — Ambulatory Visit (INDEPENDENT_AMBULATORY_CARE_PROVIDER_SITE_OTHER): Payer: No Typology Code available for payment source | Admitting: Primary Care

## 2023-12-22 VITALS — BP 128/66 | HR 80 | Temp 97.3°F | Ht 62.0 in | Wt 124.0 lb

## 2023-12-22 DIAGNOSIS — L910 Hypertrophic scar: Secondary | ICD-10-CM | POA: Diagnosis not present

## 2023-12-22 DIAGNOSIS — Z9889 Other specified postprocedural states: Secondary | ICD-10-CM

## 2023-12-22 MED ORDER — LIDOCAINE 5 % EX OINT: 1.0000 | TOPICAL_OINTMENT | Freq: Every day | CUTANEOUS | 0 refills | Status: DC | PRN

## 2023-12-22 NOTE — Patient Instructions (Signed)
You may apply the lidocaine ointment daily as needed for pain.   It was a pleasure to see you today!

## 2023-12-22 NOTE — Assessment & Plan Note (Addendum)
Suspect symptoms are secondary to keloid scarring, scar tissue from surgery. No evidence of infection or post operative complication.   Discussed options for treatment. Will start with topical lidocaine 5% ointment daily. Consider referral to dermatology for intralesional corticosteroids.   She will update.
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
## 2024-05-29 ENCOUNTER — Ambulatory Visit
Admission: EM | Admit: 2024-05-29 | Discharge: 2024-05-29 | Disposition: A | Discharge status: Home / Self Care | Attending: Internal Medicine | Admitting: Internal Medicine

## 2024-05-29 ENCOUNTER — Encounter: Payer: Self-pay | Admitting: Emergency Medicine

## 2024-05-29 DIAGNOSIS — N3000 Acute cystitis without hematuria: Secondary | ICD-10-CM | POA: Insufficient documentation

## 2024-05-29 LAB — POCT URINALYSIS DIP (MANUAL ENTRY)
Bilirubin, UA: NEGATIVE
Blood, UA: NEGATIVE
Glucose, UA: NEGATIVE mg/dL
Ketones, POC UA: NEGATIVE mg/dL
Nitrite, UA: POSITIVE — AB
Protein Ur, POC: NEGATIVE mg/dL
Spec Grav, UA: 1.02 (ref 1.010–1.025)
Urobilinogen, UA: 0.2 U/dL
pH, UA: 6 (ref 5.0–8.0)

## 2024-05-29 MED ORDER — FLUCONAZOLE 150 MG PO TABS
150.0000 mg | ORAL_TABLET | Freq: Every day | ORAL | 0 refills | Status: AC
Start: 2024-05-29 — End: 2024-05-31

## 2024-05-29 MED ORDER — NITROFURANTOIN MONOHYD MACRO 100 MG PO CAPS: 100.0000 mg | ORAL_CAPSULE | Freq: Two times a day (BID) | ORAL | 0 refills | Status: DC

## 2024-05-29 NOTE — ED Triage Notes (Signed)
 Pt reports some lower abd pains, foul odor with urine and urinating less for over a week.  Reports will have urge to urinate but unable. Denies dysuria or blood.

## 2024-05-29 NOTE — Discharge Instructions (Addendum)
 Urinalysis done today shows a urinary tract infection.  We will treat this with antibiotics.  Given the recurrent nature of the urinary tract infections we will also send the urine off for culture to ensure that the antibiotics will be adequate.  If we need to change anything we will contact you.  Due to recurrent urinary tract infections you may want to consider following up with a urologist and we have attached the name and number of the local urology group.  We have treated with the following: Macrobid  100 mg twice daily for 5 days. This is an antibiotic. Diflucan 150 mg take 1 tablet at the first signs of a yeast infection and then repeat in 3 days.  Drink plenty of water and avoid a lot caffeine  Can schedule an appointment with urology for recurrent urinary tract infections Return to urgent care or PCP if symptoms worsen or fail to resolve.
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
## 2024-05-31 ENCOUNTER — Ambulatory Visit (HOSPITAL_COMMUNITY): Payer: Self-pay

## 2024-05-31 LAB — URINE CULTURE: Culture: >=100000 — AB

## 2024-06-30 ENCOUNTER — Encounter: Payer: Self-pay | Admitting: Primary Care

## 2024-06-30 ENCOUNTER — Telehealth (INDEPENDENT_AMBULATORY_CARE_PROVIDER_SITE_OTHER): Admitting: Primary Care

## 2024-06-30 DIAGNOSIS — N390 Urinary tract infection, site not specified: Secondary | ICD-10-CM | POA: Diagnosis not present

## 2024-06-30 NOTE — Patient Instructions (Signed)
 You will either be contacted via phone regarding your referral to urology, or you may receive a letter on your MyChart portal from our referral team with instructions for scheduling an appointment. Please let us  know if you have not been contacted by anyone within two weeks.  I will post a letter to your MyChart portal for the accommodation.  It was a pleasure to see you today!

## 2024-06-30 NOTE — Progress Notes (Signed)
 Patient ID: Tanya Burnett, female    DOB: Apr 08, 2001, 23 y.o.   MRN: [id number]  Virtual visit completed through [employer]ility, a video enabled telemedicine application. Due to national recommendations of social distancing due to COVID-19, a virtual visit is felt to be most appropriate for this patient at this time. Reviewed limitations, risks, security and privacy concerns of performing a virtual visit and the availability of in person appointments. I also reviewed that there may be a patient responsible charge related to this service. The patient agreed to proceed.   Patient location: home Provider location: [place] [facility], office Persons participating in this virtual visit: patient, provider   If any vitals were documented, they were collected by patient at home unless specified below.    There were no vitals taken for this visit.   CC: urinary frequency Subjective:   HPI: Tanya Burnett is a 23 y.o. female presenting on 06/30/2024 for Referral (Requesting referral to urologist for frequent UTI/Requesting a letter for accommodation to have more bathroom breaks at work to avoid recurrent UTIs)  History of acute cystitis.  Last infection was in June 2025, culture positive for E. coli with pan resistance.  Also with culture positive cystitis in March 2024 with E. coli with pan resistance, November 2023 for Klebsiella pneumoniae with resistance.  Today she is requesting a referral to urology for recurrent UTI and a letter of accommodation for work to allow for more bathroom breaks. She reports ongoing and recurrent UTIs over the last 1-2 years. She typically goes to [facility] urgent care for her UTI symptoms, has been treated multiple times with antibiotics.  She lives at [facility] .  She endorses drinking a lot of water normally. 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 She hardly gets the full  15 minutes or 30 minutes for her breaks. She has been reprimanded for using the bathroom outside of her assigned break times. She believes that her inability to urinate is contributing to her recurrent UTIs as she is holding her urine.  She has had greater than 3 urinary tract infections this year.  Her supervisor requested that she get a letter of accommodation from PCP.  She would like the opportunity to urinate every 1 hour during her work day.        Relevant past medical, surgical, family and social history reviewed and updated as indicated. Interim medical history since our last visit reviewed. Allergies and medications reviewed and updated. Outpatient Medications Prior to Visit  Medication Sig Dispense Refill   EPINEPHrine  0.3 mg/0.3 mL IJ SOAJ injection Inject 0.3 mg into the muscle as needed for anaphylaxis. (Patient not taking: Reported on 06/30/2024) 1 each 0   etonogestrel (NEXPLANON) 68 MG IMPL implant 68 mg by Subdermal route. (Patient not taking: Reported on 06/30/2024)     lidocaine  (XYLOCAINE ) 5 % ointment Apply 1 Application topically daily as needed. (Patient not taking: Reported on 06/30/2024) 35.44 g 0   nitrofurantoin , macrocrystal-monohydrate, (MACROBID ) 100 MG capsule Take 1 capsule (100 mg total) by mouth 2 (two) times daily. (Patient not taking: Reported on 06/30/2024) 10 capsule 0   No facility-administered medications prior to visit.     Per HPI unless specifically indicated in ROS section below Review of Systems  Constitutional:  Negative for fever.  Genitourinary:  Negative for dysuria, frequency and urgency.   Objective:  There were no  vitals taken for this visit.  Wt Readings from Last 3 Encounters:  12/22/23 124 lb (56.2 kg)  11/06/23 125 lb (56.7 kg)  10/31/23 127 lb 6.4 oz (57.8 kg)       Physical exam: General: Alert and oriented x 3, no distress, does not appear sickly  Pulmonary: Speaks in complete sentences without increased work of breathing, no  cough during visit.  Psychiatric: Normal mood, thought content, and behavior.     Results for orders placed or performed during the hospital encounter of 05/29/24  POCT urinalysis dipstick   Collection Time: 05/29/24 10:42 AM  Result Value Ref Range   Color, UA yellow yellow   Clarity, UA cloudy (A) clear   Glucose, UA negative negative mg/dL   Bilirubin, UA negative negative   Ketones, POC UA negative negative mg/dL   Spec Grav, UA 8.979 8.989 - 1.025   Blood, UA negative negative   pH, UA 6.0 5.0 - 8.0   Protein Ur, POC negative negative mg/dL   Urobilinogen, UA 0.2 0.2 or 1.0 E.U./dL   Nitrite, UA Positive (A) Negative   Leukocytes, UA Trace (A) Negative  Urine Culture   Collection Time: 05/29/24 11:12 AM   Specimen: Urine, Clean Catch  Result Value Ref Range   Specimen Description URINE, CLEAN CATCH    Special Requests      NONE Performed at [facility], [address] [place], [place] [address]    Culture >=100,000 COLONIES/mL ESCHERICHIA COLI (A)    Report Status 05/31/2024 FINAL    Organism ID, Bacteria ESCHERICHIA COLI (A)       Susceptibility   Escherichia coli - MIC*    AMPICILLIN >=32 RESISTANT Resistant     CEFAZOLIN  <=4 SENSITIVE Sensitive     CEFEPIME <=0.12 SENSITIVE Sensitive     CEFTRIAXONE <=0.25 SENSITIVE Sensitive     CIPROFLOXACIN <=0.25 SENSITIVE Sensitive     GENTAMICIN >=16 RESISTANT Resistant     IMIPENEM <=0.25 SENSITIVE Sensitive     NITROFURANTOIN  <=16 SENSITIVE Sensitive     TRIMETH /SULFA  >=320 RESISTANT Resistant     AMPICILLIN/SULBACTAM 16 INTERMEDIATE Intermediate     PIP/TAZO <=4 SENSITIVE Sensitive ug/mL    * >=100,000 COLONIES/mL ESCHERICHIA COLI   Assessment & Plan:   Problem List Items Addressed This Visit       Genitourinary   Recurrent UTI - Primary   Evident within her chart today.  There are also numerous documented prior infections that we do not have access to.  Referral placed to urology due to recurrent  cystitis with pan resistance. Agreed to provide work accommodation to allow for more for bathroom breaks.  See attached letter.      Relevant Orders   Ambulatory referral to Urology     No orders of the defined types were placed in this encounter.  Orders Placed This Encounter  Procedures   Ambulatory referral to Urology    Referral Priority:   Routine    Referral Type:   Consultation    Referral Reason:   Specialty Services Required    Requested Specialty:   Urology    Number of Visits Requested:   1    I discussed the assessment and treatment plan with the patient. The patient was provided an opportunity to ask questions and all were answered. The patient agreed with the plan and demonstrated an understanding of the instructions. The patient was advised to call back or seek an in-person evaluation if the symptoms worsen or  if the condition fails to improve as anticipated.  Follow up plan:  You will either be contacted via phone regarding your referral to urology, or you may receive a letter on your MyChart portal from our referral team with instructions for scheduling an appointment. Please let us  know if you have not been contacted by anyone within two weeks.  I will post a letter to your MyChart portal for the accommodation.  It was a pleasure to see you today!   Tanya Erler K Ericson Nafziger, NP

## 2024-06-30 NOTE — Assessment & Plan Note (Signed)
 Evident within her chart today.  There are also numerous documented prior infections that we do not have access to.  Referral placed to urology due to recurrent cystitis with pan resistance. Agreed to provide work accommodation to allow for more for bathroom breaks.  See attached letter.

## 2024-07-05 NOTE — ED Triage Notes (Signed)
 Patient requests pregnancy confirmation, states that she had +pregnancy test earlier today with nausea. Denies any vaginal bleeding.

## 2024-07-09 ENCOUNTER — Ambulatory Visit (INDEPENDENT_AMBULATORY_CARE_PROVIDER_SITE_OTHER): Admitting: Primary Care

## 2024-07-09 ENCOUNTER — Ambulatory Visit: Payer: Self-pay | Admitting: Primary Care

## 2024-07-09 ENCOUNTER — Encounter: Payer: Self-pay | Admitting: Primary Care

## 2024-07-09 VITALS — BP 118/60 | HR 90 | Temp 97.9°F | Ht 62.0 in | Wt 122.0 lb

## 2024-07-09 DIAGNOSIS — B9689 Other specified bacterial agents as the cause of diseases classified elsewhere: Secondary | ICD-10-CM

## 2024-07-09 DIAGNOSIS — Z3201 Encounter for pregnancy test, result positive: Secondary | ICD-10-CM | POA: Diagnosis not present

## 2024-07-09 DIAGNOSIS — N760 Acute vaginitis: Secondary | ICD-10-CM

## 2024-07-09 LAB — HCG, QUANTITATIVE, PREGNANCY: Quantitative HCG: 6616 m[IU]/mL

## 2024-07-09 NOTE — Progress Notes (Signed)
 Subjective:    Patient ID: Rosalia [place], female    DOB: 2001-02-14, 23 y.o.   MRN: [id number]  HPI  Odie Rauen is a very pleasant 23 y.o. female with a history of migraines, recurrent cystitis, frequent headaches, chronic idiopathic urticaria resolved who presents today for positive pregnancy test and for work accommodation paperwork.  She completed a home urine pregnancy test on 07/05/2024 for which was positive.  She presented to [place] [facility] emergency department on 07/05/2024 for pregnancy confirmation. During this visit her urine pregnancy test was positive.  She is not taking any medications now except for Tylenol  as needed. She no longer has the Nexplanon. She is not on a prenatal vitamin.   Symptoms include intermittent nausea, problems with smells, vaginal cramping or bleeding. LMP was 06/04/24 approximately. She does have polydipsia which is more chronic.   A few weeks ago she presented to [facility] for urinary symptoms, was diagnosed with UTI and treated with fosfomycin for which she took. She was also diagnosed with BV, but didn't take the metrogel  that was prescribed. She questions if she has residual infection.   Review of Systems  Gastrointestinal:  Positive for nausea. Negative for abdominal pain and vomiting.  Endocrine: Positive for polydipsia and polyuria.  Genitourinary:  Negative for vaginal bleeding and vaginal discharge.         Past Medical History:  Diagnosis Date   Foul smelling urine 10/05/2021   Frequent headaches    Syncope     Social History   Socioeconomic History   Marital status: Single    Spouse name: Not on file   Number of children: Not on file   Years of education: Not on file   Highest education level: Not on file  Occupational History   Not on file  Tobacco Use   Smoking status: Never    Passive exposure: Never   Smokeless tobacco: Never  Vaping Use   Vaping status: Never Used  Substance and Sexual Activity   Alcohol   use: Never   Drug use: Never   Sexual activity: Yes    Birth control/protection: Injection  Other Topics Concern   Not on file  Social History Narrative   ** Merged History Encounter **       Social Drivers of Corporate investment banker Strain: Not on file  Food Insecurity: Not on file  Transportation Needs: Not on file  Physical Activity: Not on file  Stress: Not on file  Social Connections: Unknown (05/07/2022)   [facility]   Social Network    Social Network: Not on file  Intimate Partner Violence: Unknown (03/25/2022)   Received from [facility]   HITS    Physically Hurt: Not on file    Insult or Talk Down To: Not on file    Threaten Physical Harm: Not on file    Scream or Curse: Not on file    No past surgical history on file.  Family History  Problem Relation Age of Onset   Depression Mother    Depression Father    Alcohol  abuse Father     No Known Allergies  Current Outpatient Medications on File Prior to Visit  Medication Sig Dispense Refill   EPINEPHrine  0.3 mg/0.3 mL IJ SOAJ injection Inject 0.3 mg into the muscle as needed for anaphylaxis. (Patient not taking: Reported on 07/09/2024) 1 each 0   No current facility-administered medications on file prior to visit.    BP 118/60  Pulse 90   Temp 97.9 F (36.6 C) (Temporal)   Ht 5' 2 (1.575 m)   Wt 122 lb (55.3 kg)   LMP 06/04/2024 (Exact Date)   SpO2 99%   BMI 22.31 kg/m  Objective:   Physical Exam Exam conducted with a chaperone present.  Cardiovascular:     Rate and Rhythm: Normal rate.  Pulmonary:     Effort: Pulmonary effort is normal.  Genitourinary:    Labia:        Right: No rash, tenderness or lesion.        Left: No rash, tenderness or lesion.      Vagina: Normal.     Cervix: Discharge present.     Comments: Scant amount of whitish discharge Skin:    General: Skin is warm and dry.  Neurological:     Mental Status: She is alert.           Assessment &  Plan:  Bacterial vaginosis Assessment & Plan: Repeat wet prep ordered and pending.  Will also check for gonorrhea and chlamydia.  Await results.  Orders: -     C. trachomatis/N. gonorrhoeae RNA -     WET PREP BY MOLECULAR PROBE  Positive urine pregnancy test Assessment & Plan: Multiple positive urine pregnancy tests. Reviewed ED visit from July 2025 through [facility]where.  hCG quantitative lab ordered pending.  Orders: -     hCG, quantitative, pregnancy        Comer MARLA Gaskins, NP

## 2024-07-09 NOTE — Assessment & Plan Note (Signed)
 Repeat wet prep ordered and pending.  Will also check for gonorrhea and chlamydia.  Await results.

## 2024-07-09 NOTE — Assessment & Plan Note (Signed)
 Multiple positive urine pregnancy tests. Reviewed ED visit from July 2025 [facility].  hCG quantitative lab ordered pending.

## 2024-07-09 NOTE — Patient Instructions (Signed)
 Stop by the lab prior to leaving today. I will notify you of your results once received.   It was a pleasure to see you today!

## 2024-07-12 LAB — WET PREP BY MOLECULAR PROBE
Candida species: NOT DETECTED
Gardnerella vaginalis: NOT DETECTED
MICRO NUMBER:: 16717671
SPECIMEN QUALITY:: ADEQUATE
Trichomonas vaginosis: NOT DETECTED

## 2024-07-12 LAB — C. TRACHOMATIS/N. GONORRHOEAE RNA
C. trachomatis RNA, TMA: NOT DETECTED
N. gonorrhoeae RNA, TMA: NOT DETECTED
